# Patient Record
Sex: Female | Born: 1955 | Race: White | Hispanic: No | Smoking: Never smoker
Health system: Southern US, Community
[De-identification: ages and names within clinical notes are randomized; demographics above are authoritative.]

## PROBLEM LIST (undated history)

## (undated) DIAGNOSIS — G4730 Sleep apnea, unspecified: Secondary | ICD-10-CM

## (undated) DIAGNOSIS — I493 Ventricular premature depolarization: Secondary | ICD-10-CM

## (undated) DIAGNOSIS — H269 Unspecified cataract: Secondary | ICD-10-CM

## (undated) DIAGNOSIS — H409 Unspecified glaucoma: Secondary | ICD-10-CM

## (undated) DIAGNOSIS — Z9889 Other specified postprocedural states: Secondary | ICD-10-CM

## (undated) DIAGNOSIS — M67471 Ganglion, right ankle and foot: Secondary | ICD-10-CM

## (undated) DIAGNOSIS — M5030 Other cervical disc degeneration, unspecified cervical region: Secondary | ICD-10-CM

## (undated) DIAGNOSIS — I10 Essential (primary) hypertension: Secondary | ICD-10-CM

## (undated) DIAGNOSIS — G4700 Insomnia, unspecified: Secondary | ICD-10-CM

## (undated) DIAGNOSIS — R51 Headache: Secondary | ICD-10-CM

## (undated) DIAGNOSIS — R519 Headache, unspecified: Secondary | ICD-10-CM

## (undated) DIAGNOSIS — K219 Gastro-esophageal reflux disease without esophagitis: Secondary | ICD-10-CM

## (undated) DIAGNOSIS — K279 Peptic ulcer, site unspecified, unspecified as acute or chronic, without hemorrhage or perforation: Secondary | ICD-10-CM

## (undated) DIAGNOSIS — E785 Hyperlipidemia, unspecified: Secondary | ICD-10-CM

## (undated) DIAGNOSIS — E669 Obesity, unspecified: Secondary | ICD-10-CM

## (undated) DIAGNOSIS — R112 Nausea with vomiting, unspecified: Secondary | ICD-10-CM

## (undated) DIAGNOSIS — H16001 Unspecified corneal ulcer, right eye: Secondary | ICD-10-CM

## (undated) DIAGNOSIS — M7541 Impingement syndrome of right shoulder: Secondary | ICD-10-CM

## (undated) DIAGNOSIS — J189 Pneumonia, unspecified organism: Secondary | ICD-10-CM

## (undated) DIAGNOSIS — J45909 Unspecified asthma, uncomplicated: Secondary | ICD-10-CM

## (undated) HISTORY — DX: Pneumonia, unspecified organism: J18.9

## (undated) HISTORY — DX: Unspecified glaucoma: H40.9

## (undated) HISTORY — PX: ABDOMINAL HYSTERECTOMY: SHX81

## (undated) HISTORY — PX: FOOT SURGERY: SHX648

## (undated) HISTORY — PX: HAND SURGERY: SHX662

## (undated) HISTORY — PX: OTHER SURGICAL HISTORY: SHX169

## (undated) HISTORY — DX: Hyperlipidemia, unspecified: E78.5

## (undated) HISTORY — PX: REDUCTION MAMMAPLASTY: SUR839

## (undated) HISTORY — DX: Unspecified asthma, uncomplicated: J45.909

## (undated) HISTORY — DX: Gastro-esophageal reflux disease without esophagitis: K21.9

## (undated) HISTORY — PX: CERVICAL FUSION: SHX112

## (undated) HISTORY — PX: COLONOSCOPY: SHX174

## (undated) HISTORY — DX: Other cervical disc degeneration, unspecified cervical region: M50.30

## (undated) HISTORY — DX: Sleep apnea, unspecified: G47.30

## (undated) HISTORY — DX: Peptic ulcer, site unspecified, unspecified as acute or chronic, without hemorrhage or perforation: K27.9

## (undated) HISTORY — DX: Insomnia, unspecified: G47.00

## (undated) HISTORY — DX: Essential (primary) hypertension: I10

## (undated) HISTORY — PX: FOOT NEUROMA SURGERY: SHX646

---

## 1990-09-23 HISTORY — PX: SALIVARY GLAND SURGERY: SHX768

## 2003-05-20 ENCOUNTER — Encounter: Admission: RE | Admit: 2003-05-20 | Discharge: 2003-05-20 | Payer: Self-pay | Admitting: *Deleted

## 2003-05-26 ENCOUNTER — Ambulatory Visit (HOSPITAL_COMMUNITY): Admission: RE | Admit: 2003-05-26 | Discharge: 2003-05-26 | Payer: Self-pay | Admitting: Gastroenterology

## 2003-12-22 ENCOUNTER — Encounter: Admission: RE | Admit: 2003-12-22 | Discharge: 2003-12-22 | Payer: Self-pay | Admitting: *Deleted

## 2005-03-18 ENCOUNTER — Encounter: Admission: RE | Admit: 2005-03-18 | Discharge: 2005-03-18 | Payer: Self-pay | Admitting: Internal Medicine

## 2005-05-18 ENCOUNTER — Encounter: Admission: RE | Admit: 2005-05-18 | Discharge: 2005-05-18 | Payer: Self-pay | Admitting: Orthopaedic Surgery

## 2005-06-07 ENCOUNTER — Encounter: Admission: RE | Admit: 2005-06-07 | Discharge: 2005-06-07 | Payer: Self-pay | Admitting: Obstetrics and Gynecology

## 2005-06-18 ENCOUNTER — Other Ambulatory Visit: Admission: RE | Admit: 2005-06-18 | Discharge: 2005-06-18 | Payer: Self-pay | Admitting: Obstetrics and Gynecology

## 2005-10-25 ENCOUNTER — Encounter (INDEPENDENT_AMBULATORY_CARE_PROVIDER_SITE_OTHER): Payer: Self-pay | Admitting: *Deleted

## 2005-10-25 ENCOUNTER — Ambulatory Visit (HOSPITAL_COMMUNITY): Admission: RE | Admit: 2005-10-25 | Discharge: 2005-10-25 | Payer: Self-pay | Admitting: Obstetrics and Gynecology

## 2006-01-31 ENCOUNTER — Encounter: Admission: RE | Admit: 2006-01-31 | Discharge: 2006-01-31 | Payer: Self-pay | Admitting: *Deleted

## 2007-02-09 ENCOUNTER — Encounter: Admission: RE | Admit: 2007-02-09 | Discharge: 2007-02-09 | Payer: Self-pay | Admitting: Internal Medicine

## 2008-01-15 ENCOUNTER — Encounter (INDEPENDENT_AMBULATORY_CARE_PROVIDER_SITE_OTHER): Payer: Self-pay | Admitting: Orthopedic Surgery

## 2008-01-15 ENCOUNTER — Ambulatory Visit (HOSPITAL_BASED_OUTPATIENT_CLINIC_OR_DEPARTMENT_OTHER): Admission: RE | Admit: 2008-01-15 | Discharge: 2008-01-15 | Payer: Self-pay | Admitting: Orthopedic Surgery

## 2009-03-12 ENCOUNTER — Emergency Department (HOSPITAL_COMMUNITY): Admission: EM | Admit: 2009-03-12 | Discharge: 2009-03-12 | Payer: Self-pay | Admitting: Emergency Medicine

## 2009-11-06 ENCOUNTER — Encounter: Admission: RE | Admit: 2009-11-06 | Discharge: 2009-11-06 | Payer: Self-pay | Admitting: Internal Medicine

## 2010-11-26 ENCOUNTER — Encounter (HOSPITAL_COMMUNITY): Payer: Self-pay

## 2010-11-28 ENCOUNTER — Encounter (HOSPITAL_COMMUNITY): Payer: Self-pay

## 2010-12-31 LAB — STREP A DNA PROBE: Group A Strep Probe: NEGATIVE

## 2010-12-31 LAB — RAPID STREP SCREEN (MED CTR MEBANE ONLY): Streptococcus, Group A Screen (Direct): NEGATIVE

## 2011-02-05 NOTE — Op Note (Signed)
Michele Quinn, Michele Quinn             ACCOUNT NO.:  [id number]   MEDICAL RECORD NO.:  [id number]          PATIENT TYPE:  AMB   LOCATION:  DSC                          FACILITY:  [facility]   PHYSICIAN:  Katy Fitch. Sypher, M.D. DATE OF BIRTH:  [birth year]   DATE OF PROCEDURE:  01/15/2008  DATE OF DISCHARGE:                               OPERATIVE REPORT   PREOPERATIVE DIAGNOSES:  Chronic draining mucoid cyst, dorsal aspect of  right thumb nail fold with early nail plate deformity and x-ray evidence  of progressive degenerative arthritis, right thumb interphalageal joint.   POSTOPERATIVE DIAGNOSES:  Chronic draining mucoid cyst, dorsal aspect of  right thumb nail fold with early nail plate deformity and x-ray evidence  of progressive degenerative arthritis, right thumb interphalageal joint.   OPERATION:  1. Debridement of mucoid cyst, right thumb nail fold.  2. Arthrotomy of right thumb interphalangeal joint with synovectomy,      removal of marginal osteophytes at base of the distal phalanx and      head of proximal phalanx, pressure irrigation of interphalangeal      joint, and synovial biopsy.   OPERATING SURGEON:  Katy Fitch. Sypher, MD   ASSISTANT:  Marveen Reeks Dasnoit, PA-C   ANESTHESIA:  2% lidocaine and metacarpal head level block, right thumb  supplemented by IV sedation.   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   She reported at least 3 episodes of mucoid cyst enlargement, rupture,  and drainage.  She was concerned that she was at risk to develop septic  arthritis at the DIP joint.  She was beginning to develop a small groove  in her central nail plate of the right thumb.   She sought a hand surgery consult and was advised to consider cyst  debridement, joint debridement, and  synovectomy in an effort to try to  resolve this process.   She understands that the treatment of mucoid cyst is an  art  not a true  science.  It is understood from collective experience that joint  debridement and marginal osteophyte excision as well as cyst excision is  typically successful in resolving this predicament.  There are no  guarantees, however, that she could not develop another mucoid cyst as  her arthritis progresses.   After informed consent, she was brought to the operating at this time.   Michele Quinn was brought to the operating room and placed in supine  position on the operating table.   Following light sedation, the right arm was prepped with Betadine soap  solution and sterilely draped.  A pneumatic tourniquet was applied in  the proximal right brachium.  Our intention was to use a digital  tourniquet.   After Betadine prep, 2% lidocaine was infiltrated into the metacarpal  head region surrounding the digital nerves and dorsal sensory branches  of the radial superficial sensory branches to obtain a thumb block.  After 5 minutes,  excellent anesthesia was achieved.   The right arm was then prepped with Betadine soap solution and sterilely  draped.  1 gram of Ancef was administered as an IV prophylactic  antibiotic.  The thumb was exsanguinated with a gauze wrap and a 0.5-  inch Penrose drain placed at the MP joint level as a digital tourniquet.   The procedure commenced with a circumferential dissection of the mucoid  cyst.  A small margin of normal skin was resected including the entire  cyst.  The cyst was followed to the dorsal radial aspect of the joint.  The sinus tract was curetted with a microcurette followed by arthrotomy  on the radial aspect of the right thumb IP joint.  The capsule between  the radial collateral ligament and the terminal extensor tendon slip was  resected followed by synovectomy using a microrongeur.  There was a  large  osteophyte at the base of the proximal phalangeal head dorsally.  This was removed with a microcurette and use of rongeur.  After complete  synovectomy, the joint was irrigated with pressure utilizing a 19-gauge  dental needle.   The distal phalangeal base was then inspected and found to have a  marginal osteophyte that was debrided with a fine rongeur.  The  collateral ligament was left intact.  The joint was irrigated a final  time followed by repair of the skin with trauma sutures of 5-0 nylon.  The wound was dressed with Xeroflo sterile gauze and a Coban thumb spica  dressing.  There were no apparent complications.   Michele Quinn tolerated surgery and anesthesia well.  She was transferred  to the recovery room with stable signs.   She will be discharged home with prescriptions for Darvocet-N 100 1 p.o.  q. 4-6h. p.r.n. pain 20 tablets without refill.  Also, Keflex 500 mg 1  p.o. q.8 h. x4 days as a prophylactic antibiotic.      Katy Fitch Sypher, M.D.  Electronically Signed     RVS/MEDQ  D:  01/15/2008  T:  01/16/2008  Job:  [id number]   cc:   Thora Lance, M.D.

## 2011-02-08 NOTE — Op Note (Signed)
NAME:  NAI, BORROMEO                       ACCOUNT NO.:  [id number]   MEDICAL RECORD NO.:  [id number]                   PATIENT TYPE:  AMB   LOCATION:  ENDO                                 FACILITY:  [facility]   PHYSICIAN:  Bernette Redbird, M.D.                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  However, her ultrasound did not show any gallstones.  She has a remote  history of peptic ulcer disease and in fact has been using Advil a fair  amount in the past month, but she has not got any relief so far by the use  of Protonix.   FINAL DIAGNOSIS:  Small hiatal hernia, otherwise normal exam.   PROCEDURE:  The nature, purpose, and risks of the procedure were familiar to  the patient and she provided consent.  At her request, the procedure was  done without sedation so that she could go right back to work.  She came in  a fasted state to the Endoscopy Unit and was given viscous lidocaine as a  gargle to anesthetize the throat.   With the patient in the left lateral decubitus position, I passed the small  caliber adult Olympus video-endoscope under direct vision.  The vocal cords  were not well seen, but the esophagus was very readily entered and had  normal mucosa throughout its entire length without evidence of any reflux  esophagitis, Barrett esophagus, varices, infection, or neoplasia.  There was  a 2 cm hiatal hernia present without any significant esophageal ring.  The  stomach was entered and contained no residual.  The gastric mucosa was  normal, without evidence of gastritis, erosions, ulcers,  polyps, or masses,  and the retroflex view of the proximal stomach was unremarkable.  The  pylorus, duodenal bulb, and second duodenum were normal.  The duodenal bulb  was reinspected, as was the antrum of the stomach and, again, no evidence of  ulcer disease was seen.   The scope was removed from the patient, who tolerated the procedure well and  without apparent complication.   IMPRESSION:  Small hiatal hernia, otherwise normal upper endoscopy.  No  source of recent episodic abdominal pain identified.    PLAN:  Proceed to CCK stimulated hepatobiliary scan to look for further  evidence of gallbladder disease, despite the negative ultrasound test.  Bernette Redbird, M.D.    RB/MEDQ  D:  05/26/2003  T:  05/27/2003  Job:  [id number]   cc:   Angelia Mould. Derrell Lolling, M.D.  [address]  [place]  [place] [address]  Fax: [phone]   Thora Lance, M.D.  [address]  [place]  [place] [address]  Fax: [phone]

## 2011-02-08 NOTE — H&P (Signed)
Michele Quinn, Michele Quinn             ACCOUNT NO.:  [id number]   MEDICAL RECORD NO.:  [id number]          PATIENT TYPE:  AMB   LOCATION:  SDC                           [facility]   PHYSICIAN:  Guy Sandifer. Henderson Cloud, M.D. DATE OF BIRTH:  [birth year]   DATE OF ADMISSION:  10/25/2005  DATE OF DISCHARGE:                                HISTORY & PHYSICAL   CHIEF COMPLAINT:  Ovarian cyst.   HISTORY OF PRESENT ILLNESS:  This patient is a 55 year old married white  female, G3, P2, status post vaginal hysterectomy and A&P repair  approximately 15 years ago.  She recently had an MRI done to evaluate her  hip pain.  There was an incidental finding of a 4 cm right ovarian cyst.  On  June 07, 2005, a repeat ultrasound was consistent with a 4.5 cm simple  cyst of the right ovary.  The left ovary had two small follicles.  She has  had some bumper dyspareunia on the right side on and off.  Repeat ultrasound  in my office on October 17, 2005, revealed a 5.1 cm simple cyst of the right  ovary and a 1.7 cm simple cyst of the left ovary.  CA125 level on August 19, 2005, is normal at 21.6.  After discussion of the options, she is being  admitted for laparoscopy with bilateral salpingo-oophorectomy.  The patient  has been having some vasomotor symptoms on and off.  The potential risks and  complications of surgery have been discussed preoperatively.   PAST MEDICAL HISTORY:  1.  History of reflux.  2.  History of superficial varicosities.   PAST SURGICAL HISTORY:  1.  TVH with A&P repair.  2.  Breast reduction.  3.  Septoplasty.  4.  Sinus surgery.  5.  Salivary gland removal.   OBSTETRICAL HISTORY:  Vaginal delivery x2.   MEDICATIONS:  Diovan, Lunesta and Lortab.   No known drug allergies.   FAMILY HISTORY:  Positive for diabetes, cervical cancer in grandmother,  endometrial cancer in mother, high blood pressure, coronary artery disease,  rheumatic fever, lung disease, kidney disease.   REVIEW OF SYSTEMS:  NEUROLOGIC:  Denies headache.  CARDIAC:  Denies chest  pain.  PULMONARY:  Denies shortness of breath.  GASTROINTESTINAL:  Denies  recent changes in bowel habits.   PHYSICAL EXAMINATION:  VITAL SIGNS:  Height 5 feet 5-1/2 inches.  Weight 194  pounds.  Blood pressure 124/80.  HEENT:  Without thyromegaly.  LUNGS:  Clear to auscultation.  CARDIAC:  Regular rate and rhythm.  BACK:  Without CVA tenderness.  BREASTS:  Without mass, retraction or discharge.  ABDOMEN:  Soft, nontender, without masses.  PELVIC:  Vulva, vagina without lesions.  Right adnexa mildly tender with a  diffuse mass effect.  Left adnexa nontender, without palpable masses.  EXTREMITIES:  Grossly within normal limits.  NEUROLOGIC:  Grossly within normal limits.   ASSESSMENT:  Bilateral ovarian cysts.   PLAN:  Laparoscopy with bilateral salpingo-oophorectomy.      Guy Sandifer Henderson Cloud, M.D.  Electronically Signed     JET/MEDQ  D:  10/17/2005  T:  10/17/2005  Job:  [id number]

## 2011-02-08 NOTE — Op Note (Signed)
NAMEUGOCHI, HENZLER             ACCOUNT NO.:  [id number]   MEDICAL RECORD NO.:  [id number]          PATIENT TYPE:  AMB   LOCATION:  SDC                           [facility]   PHYSICIAN:  Guy Sandifer. Henderson Cloud, M.D. DATE OF BIRTH:  [birth year]   DATE OF PROCEDURE:  10/25/2005  DATE OF DISCHARGE:                                 OPERATIVE REPORT   PREOPERATIVE DIAGNOSIS:  Bilateral ovarian cysts.   POSTOPERATIVE DIAGNOSIS:  Bilateral ovarian cysts.   OPERATION/PROCEDURE:  Laparoscopic bilateral salpingo-oophorectomy.   SURGEON:  Guy Sandifer. Henderson Cloud, M.D.   ANESTHESIA:  General endotracheal anesthesia.   ESTIMATED BLOOD LOSS:  Drops.   SPECIMENS:  Bilateral ovaries and aspirate of right ovarian cyst.   INDICATIONS AND CONSENT:  The patient is a 55 year old, married white  female, gravida 3, para 2, status post vaginal hysterectomy who has a right  ovarian cyst and small left ovarian cyst.  Details are dictated in the  history and physical.  Laparoscopic BSO is discussed with the patient  preoperatively.  Potential risks and complications were discussed  preoperatively including, but not limited to, infection, bowel, bladder,  ureteral damage, bleeding requiring transfusion and blood products, possible  transfusion reaction, HIV and  hepatitis acquisition, DVT, PE, pneumonia,  laparotomy.  All questions have been answered and consent is signed on the  chart.   FINDINGS:  Upper abdomen is grossly normal.  Peritoneum surfaces are smooth  without excrescences.  Right ovary contains an approximately 8 cm smooth  translucent cyst which is otherwise free.  Left ovary appears normal.  Posterior cul-de-sac is normal.   DESCRIPTION OF PROCEDURE:  The patient was taken to the operating room where  she was identified, placed in the dorsal lithotomy position and general  anesthesia is induced via endotracheal intubation.  She has been placed in  the dorsal lithotomy position, prepped  abdominally and vaginally.  Bladder  straight catheterized.  A ring clamp with two prep sponges is placed in the  vagina and the patient is draped in the sterile fashion.  A small  infraumbilical incision is made after the injection with 0.5% plain  Marcaine.  A disposable Veress needle is placed with a normal syringe and  drop test.  Two liters of gas were insufflated to low pressure with good  tympany in the right upper quadrant.  Veress needle is removed and using two  towel clips to elevate the anterior abdominal wall, a 10/11 X-cel disposable  trocar sleeve is placed using direct visualization method with the  diagnostic laparoscope.  Peritoneal cavity is entered without difficulty.  Reinspection was the operative laparoscopic again reveals no damage to the  surrounding structures and confirms proper placement.  Small suprapubic  incision is made and later a left lower quadrant incision is made after  careful transillumination with 5 mm disposable X-cel trocar sleeves are both  placed under direct visualization without difficulty.  The above findings  were noted.  Elevating the right ovary from the pelvic sidewall, the Gyrus  bipolar cautery cutting instrument is used to take down the  infundibulopelvic ligament and the  remainder of the ovary.  Good hemostasis  is maintained.  The 5 mm laparoscope is then placed through the suprapubic  trocar sleeve and the pouch is placed through the umbilical trocar sleeve.  After the ovary is in the pouch, an aspirator is used to aspirate  approximately 40 mL of straw-color serous fluid.  This greatly decompresses  the cyst.  There is no spill under the peritoneal cavity.  Then while  watching with the 5 mm laparoscope from below, the bag is retrieved with the  right ovary through the umbilical incision.  Going back to the operative  laparoscope, the Gyrus is again used to take down the left ovary.  It is  retrieved from the abdomen in separate  pouch in the same manner as the right  ovary.  Irrigation and careful reinspection through the umbilical trocar  sleeve and inspection under reduced pneumoperitoneum reveals excellent  hemostasis all around.  Excess fluid is removed.  Inferior trocar sleeves  are removed and the peritoneum is reduced and no bleeding is  noted from  both sides.  Pneumoperitoneum is completely reduced and umbilical trocar  sleeve is removed.  The umbilical incision is closed with simple sutures of  2-0 Vicryl.  After injecting the lower incisions with 0.5% plain Marcaine as  well, Dermabond is used to close all of the above incisions.  The ring clamp  is removed from the vagina.  All counts are correct.  The patient is  awakened and taken to the recovery room in stable condition.      Guy Sandifer Henderson Cloud, M.D.  Electronically Signed     JET/MEDQ  D:  10/25/2005  T:  10/25/2005  Job:  [id number]

## 2011-05-22 ENCOUNTER — Other Ambulatory Visit: Payer: Self-pay | Admitting: Neurological Surgery

## 2011-05-22 DIAGNOSIS — M542 Cervicalgia: Secondary | ICD-10-CM

## 2011-05-22 DIAGNOSIS — M79602 Pain in left arm: Secondary | ICD-10-CM

## 2011-05-26 ENCOUNTER — Ambulatory Visit
Admission: RE | Admit: 2011-05-26 | Discharge: 2011-05-26 | Disposition: A | Discharge status: Home / Self Care | Payer: 59 | Source: Ambulatory Visit | Attending: Neurological Surgery | Admitting: Neurological Surgery

## 2011-05-26 DIAGNOSIS — M79602 Pain in left arm: Secondary | ICD-10-CM

## 2011-05-26 DIAGNOSIS — M542 Cervicalgia: Secondary | ICD-10-CM

## 2011-06-18 LAB — BASIC METABOLIC PANEL
BUN: 21
CO2: 30
Calcium: 9.2
Chloride: 101
Creatinine, Ser: 0.75
GFR calc Af Amer: >60
GFR calc non Af Amer: >60
Glucose, Bld: 94
Potassium: 4.3
Sodium: 137

## 2011-06-18 LAB — POCT HEMOGLOBIN-HEMACUE: Hemoglobin: 15.3 — ABNORMAL HIGH

## 2011-07-08 ENCOUNTER — Ambulatory Visit
Admission: RE | Admit: 2011-07-08 | Discharge: 2011-07-08 | Disposition: A | Discharge status: Home / Self Care | Payer: 59 | Source: Ambulatory Visit | Attending: Neurological Surgery | Admitting: Neurological Surgery

## 2011-07-08 ENCOUNTER — Other Ambulatory Visit: Payer: Self-pay | Admitting: Neurological Surgery

## 2011-07-08 DIAGNOSIS — M542 Cervicalgia: Secondary | ICD-10-CM

## 2011-10-04 ENCOUNTER — Other Ambulatory Visit: Payer: Self-pay | Admitting: Orthopaedic Surgery

## 2011-10-04 DIAGNOSIS — M25552 Pain in left hip: Secondary | ICD-10-CM

## 2011-10-04 DIAGNOSIS — R1030 Lower abdominal pain, unspecified: Secondary | ICD-10-CM

## 2011-10-04 DIAGNOSIS — M25551 Pain in right hip: Secondary | ICD-10-CM

## 2011-10-08 ENCOUNTER — Inpatient Hospital Stay: Admission: RE | Admit: 2011-10-08 | Payer: 59 | Source: Ambulatory Visit

## 2011-10-15 ENCOUNTER — Ambulatory Visit
Admission: RE | Admit: 2011-10-15 | Discharge: 2011-10-15 | Disposition: A | Discharge status: Home / Self Care | Payer: 59 | Source: Ambulatory Visit | Attending: Orthopaedic Surgery | Admitting: Orthopaedic Surgery

## 2011-10-15 DIAGNOSIS — M25551 Pain in right hip: Secondary | ICD-10-CM

## 2011-10-15 DIAGNOSIS — M25552 Pain in left hip: Secondary | ICD-10-CM

## 2011-10-15 DIAGNOSIS — R1030 Lower abdominal pain, unspecified: Secondary | ICD-10-CM

## 2011-11-22 ENCOUNTER — Telehealth (INDEPENDENT_AMBULATORY_CARE_PROVIDER_SITE_OTHER): Payer: Self-pay | Admitting: General Surgery

## 2011-11-22 ENCOUNTER — Other Ambulatory Visit (INDEPENDENT_AMBULATORY_CARE_PROVIDER_SITE_OTHER): Payer: Self-pay | Admitting: Surgery

## 2011-11-22 DIAGNOSIS — J40 Bronchitis, not specified as acute or chronic: Secondary | ICD-10-CM

## 2011-11-22 MED ORDER — AZITHROMYCIN 250 MG PO TABS: ORAL_TABLET | ORAL | Status: AC

## 2011-11-22 NOTE — Telephone Encounter (Signed)
Zpak called to [employer]

## 2011-11-25 NOTE — Progress Notes (Signed)
Patient has sx of URI X 5-6 days. Now with more purulent nasal discharge and cough. Clinically appears to have developing sinusitis and has Hx bronchitis and may be also developing this - although no fever or productive cough yet. Will treat with Z-pack empirically

## 2012-07-08 ENCOUNTER — Other Ambulatory Visit: Payer: Self-pay | Admitting: Internal Medicine

## 2012-07-08 DIAGNOSIS — Z1231 Encounter for screening mammogram for malignant neoplasm of breast: Secondary | ICD-10-CM

## 2012-08-13 ENCOUNTER — Ambulatory Visit: Payer: 59

## 2012-10-23 ENCOUNTER — Ambulatory Visit
Admission: RE | Admit: 2012-10-23 | Discharge: 2012-10-23 | Disposition: A | Discharge status: Home / Self Care | Payer: 59 | Source: Ambulatory Visit | Attending: Internal Medicine | Admitting: Internal Medicine

## 2012-10-23 DIAGNOSIS — Z1231 Encounter for screening mammogram for malignant neoplasm of breast: Secondary | ICD-10-CM

## 2013-09-23 DIAGNOSIS — G4730 Sleep apnea, unspecified: Secondary | ICD-10-CM

## 2013-09-23 HISTORY — DX: Sleep apnea, unspecified: G47.30

## 2013-11-11 ENCOUNTER — Other Ambulatory Visit: Payer: Self-pay | Admitting: Internal Medicine

## 2013-11-11 ENCOUNTER — Ambulatory Visit
Admission: RE | Admit: 2013-11-11 | Discharge: 2013-11-11 | Disposition: A | Discharge status: Home / Self Care | Payer: 59 | Source: Ambulatory Visit | Attending: Internal Medicine | Admitting: Internal Medicine

## 2013-11-11 DIAGNOSIS — R509 Fever, unspecified: Secondary | ICD-10-CM

## 2013-11-17 ENCOUNTER — Other Ambulatory Visit (INDEPENDENT_AMBULATORY_CARE_PROVIDER_SITE_OTHER): Payer: Self-pay | Admitting: Surgery

## 2013-11-17 DIAGNOSIS — H8110 Benign paroxysmal vertigo, unspecified ear: Secondary | ICD-10-CM

## 2013-11-17 MED ORDER — MECLIZINE HCL 32 MG PO TABS: 32.0000 mg | ORAL_TABLET | Freq: Three times a day (TID) | ORAL | Status: DC | PRN

## 2014-06-21 ENCOUNTER — Other Ambulatory Visit: Payer: Self-pay | Admitting: Orthopaedic Surgery

## 2014-06-21 DIAGNOSIS — M25511 Pain in right shoulder: Secondary | ICD-10-CM

## 2014-07-08 ENCOUNTER — Encounter: Payer: Self-pay | Admitting: Cardiology

## 2014-07-08 ENCOUNTER — Other Ambulatory Visit: Payer: Self-pay

## 2014-07-08 ENCOUNTER — Ambulatory Visit (INDEPENDENT_AMBULATORY_CARE_PROVIDER_SITE_OTHER): Payer: 59

## 2014-07-08 DIAGNOSIS — R55 Syncope and collapse: Secondary | ICD-10-CM

## 2014-07-08 DIAGNOSIS — M25511 Pain in right shoulder: Secondary | ICD-10-CM

## 2014-07-09 ENCOUNTER — Encounter: Payer: Self-pay | Admitting: *Deleted

## 2014-07-13 ENCOUNTER — Telehealth: Payer: Self-pay | Admitting: *Deleted

## 2014-07-13 NOTE — Telephone Encounter (Signed)
Dropped monitor off on Monday. Was ordered by DOD, referred by Dr. Valentina LucksGriffin. Informed patient that monitor report has not been received yet, once it is,it will be reviewed by MD. After that, she will be made aware of the results. Verbalizes understanding and agreement. She is hoping to hear by the end of the week.

## 2014-07-13 NOTE — Telephone Encounter (Signed)
New message ° ° ° °Pt is calling for monitor results. °

## 2015-02-28 ENCOUNTER — Other Ambulatory Visit: Payer: Self-pay

## 2015-02-28 DIAGNOSIS — Z1231 Encounter for screening mammogram for malignant neoplasm of breast: Secondary | ICD-10-CM

## 2015-03-14 ENCOUNTER — Ambulatory Visit
Admission: RE | Admit: 2015-03-14 | Discharge: 2015-03-14 | Disposition: A | Discharge status: Home / Self Care | Payer: 59 | Source: Ambulatory Visit

## 2015-03-14 DIAGNOSIS — Z1231 Encounter for screening mammogram for malignant neoplasm of breast: Secondary | ICD-10-CM

## 2015-07-28 ENCOUNTER — Ambulatory Visit
Admission: RE | Admit: 2015-07-28 | Discharge: 2015-07-28 | Disposition: A | Discharge status: Home / Self Care | Payer: Commercial Managed Care - HMO | Source: Ambulatory Visit | Attending: Orthopaedic Surgery | Admitting: Orthopaedic Surgery

## 2015-11-28 ENCOUNTER — Other Ambulatory Visit: Payer: Self-pay | Admitting: Neurological Surgery

## 2015-11-28 DIAGNOSIS — M545 Low back pain: Secondary | ICD-10-CM

## 2015-12-03 ENCOUNTER — Ambulatory Visit
Admission: RE | Admit: 2015-12-03 | Discharge: 2015-12-03 | Disposition: A | Discharge status: Home / Self Care | Payer: Commercial Managed Care - HMO | Source: Ambulatory Visit | Attending: Neurological Surgery | Admitting: Neurological Surgery

## 2015-12-03 DIAGNOSIS — M545 Low back pain: Secondary | ICD-10-CM

## 2016-01-22 ENCOUNTER — Other Ambulatory Visit: Payer: Self-pay | Admitting: Neurological Surgery

## 2016-01-22 DIAGNOSIS — M4316 Spondylolisthesis, lumbar region: Secondary | ICD-10-CM

## 2016-01-24 ENCOUNTER — Ambulatory Visit
Admission: RE | Admit: 2016-01-24 | Discharge: 2016-01-24 | Disposition: A | Discharge status: Home / Self Care | Payer: Commercial Managed Care - HMO | Source: Ambulatory Visit | Attending: Neurological Surgery | Admitting: Neurological Surgery

## 2016-01-24 DIAGNOSIS — M4316 Spondylolisthesis, lumbar region: Secondary | ICD-10-CM

## 2016-01-24 MED ORDER — METHYLPREDNISOLONE ACETATE 40 MG/ML INJ SUSP (RADIOLOG
120.0000 mg | Freq: Once | INTRAMUSCULAR | Status: AC
  Administered 2016-01-24: 120 mg via INTRA_ARTICULAR

## 2016-01-24 MED ORDER — IOPAMIDOL (ISOVUE-M 200) INJECTION 41%
1.0000 mL | Freq: Once | INTRAMUSCULAR | Status: AC
  Administered 2016-01-24: 1 mL via INTRA_ARTICULAR

## 2016-01-24 NOTE — Discharge Instructions (Signed)

## 2016-04-23 ENCOUNTER — Other Ambulatory Visit: Payer: Self-pay | Admitting: Neurological Surgery

## 2016-04-23 DIAGNOSIS — M4316 Spondylolisthesis, lumbar region: Secondary | ICD-10-CM

## 2016-04-30 ENCOUNTER — Ambulatory Visit
Admission: RE | Admit: 2016-04-30 | Discharge: 2016-04-30 | Disposition: A | Discharge status: Home / Self Care | Payer: Commercial Managed Care - HMO | Source: Ambulatory Visit | Attending: Neurological Surgery | Admitting: Neurological Surgery

## 2016-04-30 DIAGNOSIS — M4316 Spondylolisthesis, lumbar region: Secondary | ICD-10-CM

## 2016-04-30 MED ORDER — METHYLPREDNISOLONE ACETATE 40 MG/ML INJ SUSP (RADIOLOG
120.0000 mg | Freq: Once | INTRAMUSCULAR | Status: AC
  Administered 2016-04-30: 120 mg via INTRA_ARTICULAR

## 2016-04-30 MED ORDER — IOPAMIDOL (ISOVUE-M 200) INJECTION 41%
1.0000 mL | Freq: Once | INTRAMUSCULAR | Status: AC
  Administered 2016-04-30: 1 mL via INTRA_ARTICULAR

## 2016-04-30 NOTE — Discharge Instructions (Signed)

## 2016-08-29 ENCOUNTER — Encounter (INDEPENDENT_AMBULATORY_CARE_PROVIDER_SITE_OTHER): Payer: Self-pay | Admitting: Orthopaedic Surgery

## 2016-08-29 ENCOUNTER — Ambulatory Visit (INDEPENDENT_AMBULATORY_CARE_PROVIDER_SITE_OTHER): Payer: Commercial Managed Care - HMO | Admitting: Orthopaedic Surgery

## 2016-08-29 VITALS — BP 133/70 | HR 74 | Resp 14 | Ht 64.5 in | Wt 200.0 lb

## 2016-08-29 DIAGNOSIS — M7541 Impingement syndrome of right shoulder: Secondary | ICD-10-CM | POA: Diagnosis not present

## 2016-08-29 DIAGNOSIS — M67471 Ganglion, right ankle and foot: Secondary | ICD-10-CM | POA: Diagnosis not present

## 2016-08-29 DIAGNOSIS — M75101 Unspecified rotator cuff tear or rupture of right shoulder, not specified as traumatic: Secondary | ICD-10-CM | POA: Insufficient documentation

## 2016-08-29 NOTE — Progress Notes (Signed)
Office Visit Note   Patient: Michele Quinn           Date of Birth: 06-09-1956           MRN: [id number] Visit Date: 08/29/2016              Requested by: No referring provider defined for this encounter. PCP: Lillia MountainGRIFFIN,JOHN JOSEPH, MD   Assessment & Plan: Visit Diagnoses:  1. Rotator cuff tear, non-traumatic, right   2. Impingement syndrome of right shoulder   3. Ganglion cyst of right foot     Plan:  #1: Plan for Arthroscopic subacromial decompression with distal clavicle excision, mini open rotator cuff repair possible dermis span patch.  Also, excision of ganglion cyst of the right lateral at the proximal fourth and fifth metatarsal  Follow-Up Instructions: Return will schedule surgery.   Orders:  No orders of the defined types were placed in this encounter.  No orders of the defined types were placed in this encounter.     Procedures: No procedures performed   Clinical Data: No additional findings.   Subjective: Chief Complaint  Patient presents with  . Right Shoulder - Pain    Michele Quinn is a 60 year old white female history of Right shoulder pain x over 1- 2  years, worsening x 1 week, difficulty sleeping, limited range of motion, weakness, Advil x 1 day, nothing working for pain, injury x 3-4 years ago.   She was apparently scheduled for arthroscopic surgery at the end of last year but apparently developed bronchitis had to cancel that and then also canceled because she had a trip coming up in January. She suffered the significantly over the past year but has waited till now cause over the past week she started developing severe pain in the shoulder. She had been using a 0 radius lawnmower and had been doing some raking. Her symptoms now pain with every motion as well as given aching pain issues tries to sleep. Seen today for evaluation.    Review of Systems  Constitutional: Negative.   HENT: Negative.   Respiratory:       Sleep apnea and seasonal asthma    Cardiovascular:       Hypertension  Gastrointestinal: Negative.   Genitourinary: Negative.   Skin: Negative.   Neurological: Negative.   Hematological: Negative.   Psychiatric/Behavioral: Negative.      Objective: Vital Signs: BP 133/70 (BP Location: Left Arm, Patient Position: Sitting, Cuff Size: Normal)   Pulse 74   Resp 14   Ht 5' 4.5" (1.638 m)   Wt 200 lb (90.7 kg)   BMI 33.80 kg/m   Physical Exam  Constitutional: She is oriented to person, place, and time. She appears well-developed and well-nourished.  HENT:  Head: Normocephalic and atraumatic.  Eyes: EOM are normal. Pupils are equal, round, and reactive to light.  Neck:  No carotid bruits  Pulmonary/Chest: Effort normal.  Neurological: She is alert and oriented to person, place, and time.  Skin: Skin is warm and dry.  Psychiatric: She has a normal mood and affect. Her behavior is normal. Judgment and thought content normal.    Right Ankle Exam  Right ankle exam is normal.  Comments:  Cystic lesion over the small fourth fifth metatarsal area multilobulated   Right Shoulder Exam   Tenderness  The patient is experiencing tenderness in the acromioclavicular joint, acromion and biceps tendon.  Muscle Strength  Abduction: 3/5  Internal Rotation: 4/5  External Rotation: 3/5  Supraspinatus: 3/5  Subscapularis: 4/5   Tests  Apprehension: positive Impingement: positive  Other  Sensation: normal Pulse: present  Comments:  Decreased range of motion secondary to pain throughout the shoulder.      Specialty Comments:  No specialty comments available.  Imaging: CLINICAL DATA:  Right shoulder pain for 3 years. Heard a pop pulling herself up. Limited range of motion.  EXAM: MRI OF THE RIGHT SHOULDER WITHOUT CONTRAST  TECHNIQUE: Multiplanar, multisequence MR imaging of the shoulder was performed. No intravenous contrast was administered.  COMPARISON:  None.  FINDINGS: Rotator cuff: Severe  tendinosis of the supraspinatus tendon with a small insertional interstitial tear. Severe tendinosis of the infraspinatus tendon with a possible small partial thickness insertional bursal surface tear (image 6/series 5). Teres minor tendon is intact. Subscapularis tendon is intact.  Muscles: No atrophy or fatty replacement of nor abnormal signal within, the muscles of the rotator cuff.  Biceps long head:  Intact.  Acromioclavicular Joint: Normal acromioclavicular joint. Type II acromion. Incidental note made of an os acromiale with mild edema adjacent to the synchondrosis which may reflect instability. Small amount of subacromial/subdeltoid bursal fluid.  Glenohumeral Joint: No significant joint effusion. No focal chondral defect. Normal alignment.  Labrum: Grossly intact, but evaluation is limited by lack of intraarticular fluid.  Bones: No focal marrow signal abnormality. No acute fracture or dislocation. No aggressive osseous lesion.  IMPRESSION: 1. Severe tendinosis of the supraspinatus tendon with a small insertional interstitial tear. 2. Severe tendinosis of the infraspinatus tendon with a possible small partial thickness insertional bursal surface tear (image 6/series 5). 3. Mild subacromial/subdeltoid bursitis. 4. 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    PMFS History: Patient Active Problem List   Diagnosis Date Noted  . Rotator cuff tear, non-traumatic, right 08/29/2016  . Benign paroxysmal positional vertigo 11/17/2013  . Bronchitis 11/22/2011   Past Medical History:  Diagnosis Date  . Asthma   . Degenerative disc disease, cervical   . GERD (gastroesophageal reflux disease)   . Glaucoma   . Hyperlipidemia   . Hypertension   . Insomnia   . Peptic ulcer disease   . Pneumonia   . Sleep apnea 2015    Family History  Problem Relation Age of Onset  . Lymphoma Mother   . Endometrial cancer  Mother   . Hypertension Mother   . Heart attack Mother   . COPD Father   . Hypertension Father   . Diabetes Father   . Macular degeneration Father   . Heart failure Father   . Colon polyps Father   . Dementia Father   . Hyperlipidemia Sister   . Hypertension Sister   . Diabetes Brother   . Diabetes Brother     Past Surgical History:  Procedure Laterality Date  . CERVICAL FUSION    . FOOT NEUROMA SURGERY Left   . FOOT SURGERY Left   . HAND SURGERY    . reduction mammoplasty    . SALIVARY GLAND SURGERY Right 1992   Social History   Occupational History  . Not on file.   Social History Main Topics  . Smoking status: Never Smoker  . Smokeless tobacco: Never Used  . Alcohol use 1.2 - 1.8 oz/week    2 - 3 Glasses of wine per week  . Drug use: No  . Sexual activity: Not on file

## 2016-09-25 ENCOUNTER — Encounter (HOSPITAL_COMMUNITY): Payer: Self-pay

## 2016-09-25 ENCOUNTER — Encounter (HOSPITAL_COMMUNITY)
Admission: RE | Admit: 2016-09-25 | Discharge: 2016-09-25 | Disposition: A | Discharge status: Home / Self Care | Payer: Commercial Managed Care - HMO | Source: Ambulatory Visit | Attending: Orthopaedic Surgery | Admitting: Orthopaedic Surgery

## 2016-09-25 DIAGNOSIS — R9431 Abnormal electrocardiogram [ECG] [EKG]: Secondary | ICD-10-CM | POA: Diagnosis not present

## 2016-09-25 DIAGNOSIS — I10 Essential (primary) hypertension: Secondary | ICD-10-CM | POA: Insufficient documentation

## 2016-09-25 DIAGNOSIS — Z01812 Encounter for preprocedural laboratory examination: Secondary | ICD-10-CM | POA: Insufficient documentation

## 2016-09-25 DIAGNOSIS — Z01810 Encounter for preprocedural cardiovascular examination: Secondary | ICD-10-CM | POA: Diagnosis not present

## 2016-09-25 HISTORY — DX: Obesity, unspecified: E66.9

## 2016-09-25 HISTORY — DX: Headache: R51

## 2016-09-25 HISTORY — DX: Other specified postprocedural states: Z98.890

## 2016-09-25 HISTORY — DX: Ventricular premature depolarization: I49.3

## 2016-09-25 HISTORY — DX: Impingement syndrome of right shoulder: M75.41

## 2016-09-25 HISTORY — DX: Unspecified cataract: H26.9

## 2016-09-25 HISTORY — DX: Ganglion, right ankle and foot: M67.471

## 2016-09-25 HISTORY — DX: Nausea with vomiting, unspecified: R11.2

## 2016-09-25 HISTORY — DX: Unspecified corneal ulcer, right eye: H16.001

## 2016-09-25 HISTORY — DX: Headache, unspecified: R51.9

## 2016-09-25 LAB — CBC
HCT: 41.8 % (ref 36.0–46.0)
Hemoglobin: 13.6 g/dL (ref 12.0–15.0)
MCH: 28.3 pg (ref 26.0–34.0)
MCHC: 32.5 g/dL (ref 30.0–36.0)
MCV: 87.1 fL (ref 78.0–100.0)
Platelets: 269 10*3/uL (ref 150–400)
RBC: 4.8 MIL/uL (ref 3.87–5.11)
RDW: 13.1 % (ref 11.5–15.5)
WBC: 8.8 10*3/uL (ref 4.0–10.5)

## 2016-09-25 LAB — BASIC METABOLIC PANEL
Anion gap: 10 (ref 5–15)
BUN: 17 mg/dL (ref 6–20)
CO2: 26 mmol/L (ref 22–32)
Calcium: 9.7 mg/dL (ref 8.9–10.3)
Chloride: 106 mmol/L (ref 101–111)
Creatinine, Ser: 0.67 mg/dL (ref 0.44–1.00)
GFR calc Af Amer: >60 mL/min (ref >60)
GFR calc non Af Amer: >60 mL/min (ref >60)
Glucose, Bld: 97 mg/dL (ref 65–99)
Potassium: 3.9 mmol/L (ref 3.5–5.1)
Sodium: 142 mmol/L (ref 135–145)

## 2016-09-25 NOTE — Progress Notes (Signed)
Pt denies SOB, chest pain, and being under the care of a cardiologist. Pt denies having a cardiac cath but stated that an echo and stress test was performed > 10 years ago. Pt denies having a chest x rayy within the last year. Pt denies having recent labs. Pt chart forwarded to anesthesia to review cardiac history.

## 2016-09-25 NOTE — Pre-Procedure Instructions (Signed)
    Lonell GrandchildCynthia H Birchall  09/25/2016      [facility] [id number] - [place] - [address] AT [facility] [address] & [address] [place] [address] Phone: [phone] Fax: [phone]  [facility] [id number] - [place], [place] - [address] AT SEC [address] [place] [address] Phone: [phone] Fax: [phone]    Your procedure is scheduled on Tuesday, October 01, 2016  [facility]  Call this number if you have problems the morning of surgery:  [phone]   Remember:  Do not eat food or drink liquids after midnight Monday, September 30, 2016  Take these medicines the morning of surgery with A SIP OF WATER : metoprolol succinate (TOPROL-XL),  pantoprazole (PROTONIX), if needed: cetirizine (ZYRTEC) beclomethasone (QVAR)  For shortness of breath, albuterol (PROVENTIL HFA;VENTOLIN HFA)  inhaler for wheezing or shortness of breath ( bring inhalers in with you on day of surgery) Stop taking Aspirin, vitamins, fish oil and herbal medications ( Omega 3,Coenzyme Q10 (CO Q10).  Do not take any NSAIDs ie: Ibuprofen, Advil, Naproxen ( Anaprox), BC and Goody Powder or any medication containing Aspirin; stop now.  Do not wear jewelry, make-up or nail polish.  Do not wear lotions, powders, or perfumes, or deoderant.  Do not shave 48 hours prior to surgery.   Do not bring valuables to the hospital.  [facility] is not responsible for any belongings or valuables.  Contacts, dentures or bridgework may not be worn into surgery.  Leave your suitcase in the car.  After surgery it may be brought to your room.  For patients admitted to the hospital, discharge time will be determined by your treatment team.  Patients discharged the day of surgery will not be allowed to drive home.   Special instructions: Shower the night before surgery and the morning of surgery with  CHG.  Please read over the following fact sheets that you were given. Pain Booklet, Coughing and Deep Breathing and Surgical Site Infection Prevention

## 2016-09-29 NOTE — H&P (Addendum)
Patient: Michele Quinn                                    Date of Birth: 11/06/1955                                                 MRN: [id number] Visit Date: 08/29/2016                                                                     Requested by: No referring provider defined for this encounter. PCP: Lillia MountainGRIFFIN,JOHN JOSEPH, MD   Assessment & Plan: Visit Diagnoses:  1. Rotator cuff tear, non-traumatic, right   2. Impingement syndrome of right shoulder   3. Ganglion cyst of right foot     Plan:  #1: Plan for Arthroscopic subacromial decompression with distal clavicle excision, mini open rotator cuff repair possible dermis span patch.  Also, excision of ganglion cyst of the right lateral at the proximal fourth and fifth metatarsal   Orders:  No orders of the defined types were placed in this encounter.  No orders of the defined types were placed in this encounter.     Procedures: No procedures performed   Clinical Data: No additional findings.   Subjective:    Chief Complaint  Patient presents with  . Right Shoulder - Pain    Michele Quinn is a 61 year old white female history of Right shoulder pain x over 1- 2  years, worsening x 1 week, difficulty sleeping, limited range of motion, weakness, Advil x 1 day, nothing working for pain, injury x 3-4 years ago.   She was apparently scheduled for arthroscopic surgery at the end of last year but apparently developed bronchitis had to cancel that and then also canceled because she had a trip coming up in January. She suffered the significantly over the past year but has waited till now cause over the past week she started developing severe pain in the shoulder. She had been using a 0 radius lawnmower and had been doing some raking. Her symptoms now pain with every motion as well as given aching pain issues tries to sleep. Seen today for evaluation.  She also has a ganglion cyst of the right foot which is  symptomatic  Review of Systems  Constitutional: Negative.   HENT: Negative.   Respiratory:       Sleep apnea and seasonal asthma  Cardiovascular:       Hypertension  Gastrointestinal: Negative.   Genitourinary: Negative.   Skin: Negative.   Neurological: Negative.   Hematological: Negative.   Psychiatric/Behavioral: Negative.      Objective: Vital Signs: BP 133/70 (BP Location: Left Arm, Patient Position: Sitting, Cuff Size: Normal)   Pulse 74   Resp 14   Ht 5' 4.5" (1.638 m)   Wt 200 lb (90.7 kg)   BMI 33.80 kg/m   Physical Exam  Constitutional: She is oriented to person, place, and time. She appears well-developed and well-nourished.  HENT:  Head: Normocephalic and atraumatic.  Eyes: EOM are normal.  Pupils are equal, round, and reactive to light.  Neck:  No carotid bruits  Pulmonary/Chest: Effort normal.  Neurological: She is alert and oriented to person, place, and time.  Skin: Skin is warm and dry.  Psychiatric: She has a normal mood and affect. Her behavior is normal. Judgment and thought content normal.    Right Ankle Exam  Right ankle exam is normal except  Cystic lesion over the small fourth fifth metatarsal area multilobulated   Right Shoulder Exam   Tenderness  The patient is experiencing tenderness in the acromioclavicular joint, acromion and biceps tendon.  Muscle Strength  Abduction: 3/5  Internal Rotation: 4/5  External Rotation: 3/5  Supraspinatus: 3/5  Subscapularis: 4/5   Tests  Apprehension: positive Impingement: positive  Other  Sensation: normal Pulse: present  Comments:  Decreased range of motion secondary to pain throughout the shoulder.     Specialty Comments:  No specialty comments available.  Imaging: CLINICAL DATA: Right shoulder pain for 3 years. Heard a pop pulling herself up. Limited range of motion.  EXAM: MRI OF THE RIGHT SHOULDER WITHOUT CONTRAST  TECHNIQUE: Multiplanar,  multisequence MR imaging of the shoulder was performed. No intravenous contrast was administered.  COMPARISON: None.  FINDINGS: Rotator cuff: Severe tendinosis of the supraspinatus tendon with a small insertional interstitial tear. Severe tendinosis of the infraspinatus tendon with a possible small partial thickness insertional bursal surface tear (image 6/series 5). Teres minor tendon is intact. Subscapularis tendon is intact.  Muscles: No atrophy or fatty replacement of nor abnormal signal within, the muscles of the rotator cuff.  Biceps long head: Intact.  Acromioclavicular Joint: Normal acromioclavicular joint. Type II acromion. Incidental note made of an os acromiale with mild edema adjacent to the synchondrosis which may reflect instability. Small amount of subacromial/subdeltoid bursal fluid.  Glenohumeral Joint: No significant joint effusion. No focal chondral defect. Normal alignment.  Labrum: Grossly intact, but evaluation is limited by lack of intraarticular fluid.  Bones: No focal marrow signal abnormality. No acute fracture or dislocation. No aggressive osseous lesion.  IMPRESSION: 1. Severe tendinosis of the supraspinatus tendon with a small insertional interstitial tear. 2. Severe tendinosis of the infraspinatus tendon with a possible small partial thickness insertional bursal surface tear (image 6/series 5). 3. Mild subacromial/subdeltoid bursitis. 4. 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    PMFS History:     Patient Active Problem List   Diagnosis Date Noted  . Rotator cuff tear, non-traumatic, right 08/29/2016  . Benign paroxysmal positional vertigo 11/17/2013  . Bronchitis 11/22/2011       Past Medical History:  Diagnosis Date  . Asthma   . Degenerative disc disease, cervical   . GERD (gastroesophageal reflux disease)   . Glaucoma   . Hyperlipidemia   . Hypertension    . Insomnia   . Peptic ulcer disease   . Pneumonia   . Sleep apnea 2015         Family History  Problem Relation Age of Onset  . Lymphoma Mother   . Endometrial cancer Mother   . Hypertension Mother   . Heart attack Mother   . COPD Father   . Hypertension Father   . Diabetes Father   . Macular degeneration Father   . Heart failure Father   . Colon polyps Father   . Dementia Father   . Hyperlipidemia Sister   . Hypertension Sister   . Diabetes Brother   . Diabetes Brother  Past Surgical History:  Procedure Laterality Date  . CERVICAL FUSION    . FOOT NEUROMA SURGERY Left   . FOOT SURGERY Left   . HAND SURGERY    . reduction mammoplasty    . SALIVARY GLAND SURGERY Right 1992   Social History      Occupational History  . Not on file.        Social History Main Topics  . Smoking status: Never Smoker  . Smokeless tobacco: Never Used  . Alcohol use 1.2 - 1.8 oz/week    2 - 3 Glasses of wine per week  . Drug use: No  . Sexual activity: Not on file   Oris Drone. Aleda Grana [facility] [phone]  10/01/2016 10:01 AM

## 2016-09-30 MED ORDER — SODIUM CHLORIDE 0.9 % IV SOLN: 75.0000 mL/h | INTRAVENOUS | Status: DC

## 2016-09-30 MED ORDER — ACETAMINOPHEN 10 MG/ML IV SOLN
1000.0000 mg | Freq: Once | INTRAVENOUS | Status: AC
  Administered 2016-10-01: 1000 mg via INTRAVENOUS
  Filled 2016-09-30: qty 100

## 2016-09-30 MED ORDER — CEFAZOLIN SODIUM-DEXTROSE 2-4 GM/100ML-% IV SOLN
2.0000 g | INTRAVENOUS | Status: AC
  Administered 2016-10-01: 2 g via INTRAVENOUS
  Filled 2016-09-30: qty 100

## 2016-10-01 ENCOUNTER — Ambulatory Visit (HOSPITAL_COMMUNITY): Payer: Commercial Managed Care - HMO | Admitting: Certified Registered"

## 2016-10-01 ENCOUNTER — Encounter (HOSPITAL_COMMUNITY): Payer: Self-pay | Admitting: Certified Registered"

## 2016-10-01 ENCOUNTER — Ambulatory Visit (HOSPITAL_COMMUNITY)
Admission: RE | Admit: 2016-10-01 | Discharge: 2016-10-01 | Disposition: A | Discharge status: Home / Self Care | Payer: Commercial Managed Care - HMO | Source: Ambulatory Visit | Attending: Orthopaedic Surgery | Admitting: Orthopaedic Surgery

## 2016-10-01 ENCOUNTER — Encounter (HOSPITAL_COMMUNITY)
Admission: RE | Disposition: A | Discharge status: Home / Self Care | Payer: Self-pay | Source: Ambulatory Visit | Attending: Orthopaedic Surgery

## 2016-10-01 ENCOUNTER — Ambulatory Visit (HOSPITAL_COMMUNITY): Payer: Commercial Managed Care - HMO | Admitting: Vascular Surgery

## 2016-10-01 DIAGNOSIS — M67471 Ganglion, right ankle and foot: Secondary | ICD-10-CM | POA: Insufficient documentation

## 2016-10-01 DIAGNOSIS — M75121 Complete rotator cuff tear or rupture of right shoulder, not specified as traumatic: Secondary | ICD-10-CM | POA: Diagnosis not present

## 2016-10-01 DIAGNOSIS — E785 Hyperlipidemia, unspecified: Secondary | ICD-10-CM | POA: Insufficient documentation

## 2016-10-01 DIAGNOSIS — G4700 Insomnia, unspecified: Secondary | ICD-10-CM | POA: Diagnosis not present

## 2016-10-01 DIAGNOSIS — M75101 Unspecified rotator cuff tear or rupture of right shoulder, not specified as traumatic: Secondary | ICD-10-CM | POA: Diagnosis not present

## 2016-10-01 DIAGNOSIS — H409 Unspecified glaucoma: Secondary | ICD-10-CM | POA: Insufficient documentation

## 2016-10-01 DIAGNOSIS — K219 Gastro-esophageal reflux disease without esophagitis: Secondary | ICD-10-CM | POA: Diagnosis not present

## 2016-10-01 DIAGNOSIS — M65811 Other synovitis and tenosynovitis, right shoulder: Secondary | ICD-10-CM | POA: Diagnosis not present

## 2016-10-01 DIAGNOSIS — I10 Essential (primary) hypertension: Secondary | ICD-10-CM | POA: Insufficient documentation

## 2016-10-01 DIAGNOSIS — J45909 Unspecified asthma, uncomplicated: Secondary | ICD-10-CM | POA: Insufficient documentation

## 2016-10-01 DIAGNOSIS — M75111 Incomplete rotator cuff tear or rupture of right shoulder, not specified as traumatic: Secondary | ICD-10-CM | POA: Diagnosis not present

## 2016-10-01 DIAGNOSIS — M25811 Other specified joint disorders, right shoulder: Secondary | ICD-10-CM

## 2016-10-01 DIAGNOSIS — Z79899 Other long term (current) drug therapy: Secondary | ICD-10-CM | POA: Insufficient documentation

## 2016-10-01 DIAGNOSIS — M19011 Primary osteoarthritis, right shoulder: Secondary | ICD-10-CM | POA: Diagnosis not present

## 2016-10-01 DIAGNOSIS — G4730 Sleep apnea, unspecified: Secondary | ICD-10-CM | POA: Diagnosis not present

## 2016-10-01 DIAGNOSIS — G8918 Other acute postprocedural pain: Secondary | ICD-10-CM | POA: Diagnosis not present

## 2016-10-01 DIAGNOSIS — M7541 Impingement syndrome of right shoulder: Secondary | ICD-10-CM

## 2016-10-01 HISTORY — PX: SHOULDER ARTHROSCOPY WITH OPEN ROTATOR CUFF REPAIR AND DISTAL CLAVICLE ACROMINECTOMY: SHX5683

## 2016-10-01 HISTORY — PX: EAR CYST EXCISION: SHX22

## 2016-10-01 SURGERY — SHOULDER ARTHROSCOPY WITH OPEN ROTATOR CUFF REPAIR AND DISTAL CLAVICLE ACROMINECTOMY
Anesthesia: General | Site: Shoulder | Laterality: Right

## 2016-10-01 MED ORDER — SUGAMMADEX SODIUM 200 MG/2ML IV SOLN
INTRAVENOUS | Status: DC | PRN
Start: 2016-10-01 — End: 2016-10-01
  Administered 2016-10-01: 200 mg via INTRAVENOUS

## 2016-10-01 MED ORDER — ONDANSETRON HCL 4 MG/2ML IJ SOLN
INTRAMUSCULAR | Status: AC
  Filled 2016-10-01: qty 2

## 2016-10-01 MED ORDER — OXYCODONE-ACETAMINOPHEN 5-325 MG PO TABS
1.0000 | ORAL_TABLET | Freq: Once | ORAL | Status: AC
  Administered 2016-10-01: 1 via ORAL

## 2016-10-01 MED ORDER — PROPOFOL 10 MG/ML IV BOLUS
INTRAVENOUS | Status: DC | PRN
  Administered 2016-10-01: 40 mg via INTRAVENOUS
  Administered 2016-10-01: 170 mg via INTRAVENOUS

## 2016-10-01 MED ORDER — SODIUM CHLORIDE 0.9 % IR SOLN
Status: DC | PRN
  Administered 2016-10-01 (×4): 3000 mL

## 2016-10-01 MED ORDER — BUPIVACAINE-EPINEPHRINE (PF) 0.5% -1:200000 IJ SOLN
INTRAMUSCULAR | Status: DC | PRN
  Administered 2016-10-01: 25 mL via PERINEURAL

## 2016-10-01 MED ORDER — FENTANYL CITRATE (PF) 100 MCG/2ML IJ SOLN
INTRAMUSCULAR | Status: DC | PRN
Start: 2016-10-01 — End: 2016-10-01
  Administered 2016-10-01: 50 ug via INTRAVENOUS

## 2016-10-01 MED ORDER — EPHEDRINE 5 MG/ML INJ
INTRAVENOUS | Status: AC
  Filled 2016-10-01: qty 10

## 2016-10-01 MED ORDER — SCOPOLAMINE 1 MG/3DAYS TD PT72
MEDICATED_PATCH | TRANSDERMAL | Status: AC
  Administered 2016-10-01: 1.5 mg
  Filled 2016-10-01: qty 1

## 2016-10-01 MED ORDER — PHENYLEPHRINE HCL 10 MG/ML IJ SOLN
INTRAMUSCULAR | Status: DC | PRN
  Administered 2016-10-01: 20 ug/min via INTRAVENOUS

## 2016-10-01 MED ORDER — FENTANYL CITRATE (PF) 100 MCG/2ML IJ SOLN
INTRAMUSCULAR | Status: AC
  Administered 2016-10-01: 100 ug
  Filled 2016-10-01: qty 2

## 2016-10-01 MED ORDER — ROCURONIUM BROMIDE 100 MG/10ML IV SOLN
INTRAVENOUS | Status: DC | PRN
  Administered 2016-10-01: 50 mg via INTRAVENOUS

## 2016-10-01 MED ORDER — MIDAZOLAM HCL 2 MG/2ML IJ SOLN
INTRAMUSCULAR | Status: AC
  Filled 2016-10-01: qty 2

## 2016-10-01 MED ORDER — 0.9 % SODIUM CHLORIDE (POUR BTL) OPTIME
TOPICAL | Status: DC | PRN
  Administered 2016-10-01: 1000 mL

## 2016-10-01 MED ORDER — FENTANYL CITRATE (PF) 100 MCG/2ML IJ SOLN
INTRAMUSCULAR | Status: AC
  Filled 2016-10-01: qty 2

## 2016-10-01 MED ORDER — CHLORHEXIDINE GLUCONATE 4 % EX LIQD: 60.0000 mL | Freq: Once | CUTANEOUS | Status: DC

## 2016-10-01 MED ORDER — ONDANSETRON HCL 4 MG/2ML IJ SOLN
INTRAMUSCULAR | Status: DC | PRN
  Administered 2016-10-01: 4 mg via INTRAVENOUS

## 2016-10-01 MED ORDER — LACTATED RINGERS IV SOLN
INTRAVENOUS | Status: DC
  Administered 2016-10-01 (×2): via INTRAVENOUS

## 2016-10-01 MED ORDER — EPHEDRINE SULFATE 50 MG/ML IJ SOLN
INTRAMUSCULAR | Status: DC | PRN
  Administered 2016-10-01 (×2): 10 mg via INTRAVENOUS

## 2016-10-01 MED ORDER — LIDOCAINE HCL (CARDIAC) 20 MG/ML IV SOLN
INTRAVENOUS | Status: DC | PRN
  Administered 2016-10-01: 60 mg via INTRAVENOUS

## 2016-10-01 MED ORDER — ROCURONIUM BROMIDE 50 MG/5ML IV SOSY
PREFILLED_SYRINGE | INTRAVENOUS | Status: AC
  Filled 2016-10-01: qty 5

## 2016-10-01 MED ORDER — HYDROMORPHONE HCL 1 MG/ML IJ SOLN: 0.2500 mg | INTRAMUSCULAR | Status: DC | PRN

## 2016-10-01 MED ORDER — PROPOFOL 10 MG/ML IV BOLUS
INTRAVENOUS | Status: AC
  Filled 2016-10-01: qty 20

## 2016-10-01 MED ORDER — MIDAZOLAM HCL 2 MG/2ML IJ SOLN
INTRAMUSCULAR | Status: AC
  Administered 2016-10-01: 1.5 mg
  Filled 2016-10-01: qty 2

## 2016-10-01 MED ORDER — OXYCODONE-ACETAMINOPHEN 5-325 MG PO TABS: 1.0000 | ORAL_TABLET | ORAL | 0 refills | Status: AC | PRN

## 2016-10-01 MED ORDER — PROMETHAZINE HCL 25 MG/ML IJ SOLN: 6.2500 mg | INTRAMUSCULAR | Status: DC | PRN

## 2016-10-01 MED ORDER — OXYCODONE-ACETAMINOPHEN 5-325 MG PO TABS
ORAL_TABLET | ORAL | Status: AC
  Administered 2016-10-01: 1 via ORAL
  Filled 2016-10-01: qty 1

## 2016-10-01 MED ORDER — SUGAMMADEX SODIUM 200 MG/2ML IV SOLN
INTRAVENOUS | Status: AC
  Filled 2016-10-01: qty 2

## 2016-10-01 MED ORDER — LIDOCAINE 2% (20 MG/ML) 5 ML SYRINGE
INTRAMUSCULAR | Status: AC
  Filled 2016-10-01: qty 5

## 2016-10-01 SURGICAL SUPPLY — 77 items
AID PSTN UNV HD RSTRNT DISP (MISCELLANEOUS) ×2
ANCH SUT KNTLS STRL SHLDR SYS (Anchor) ×2 IMPLANT
ANCHOR PEEK ALL THREAD (Anchor) ×2 IMPLANT
ANCHOR SUT QUATTRO KNTLS 4.5 (Anchor) ×1 IMPLANT
APL SKNCLS STERI-STRIP NONHPOA (GAUZE/BANDAGES/DRESSINGS) ×2
BANDAGE ACE 3X5.8 VEL STRL LF (GAUZE/BANDAGES/DRESSINGS) ×1 IMPLANT
BANDAGE ESMARK 6X9 LF (GAUZE/BANDAGES/DRESSINGS) ×1 IMPLANT
BENZOIN TINCTURE PRP APPL 2/3 (GAUZE/BANDAGES/DRESSINGS) ×3 IMPLANT
BLADE GREAT WHITE 4.2 (BLADE) IMPLANT
BLADE SURG 11 STRL SS (BLADE) ×3 IMPLANT
BNDG CMPR 9X6 STRL LF SNTH (GAUZE/BANDAGES/DRESSINGS) ×2
BNDG ESMARK 6X9 LF (GAUZE/BANDAGES/DRESSINGS) ×3
BNDG GAUZE ELAST 4 BULKY (GAUZE/BANDAGES/DRESSINGS) ×2 IMPLANT
BUR OVAL 6.0 (BURR) ×3 IMPLANT
CANNULA ACUFLEX KIT 5X76 (CANNULA) ×3 IMPLANT
CLSR STERI-STRIP ANTIMIC 1/2X4 (GAUZE/BANDAGES/DRESSINGS) ×4 IMPLANT
COVER SURGICAL LIGHT HANDLE (MISCELLANEOUS) ×3 IMPLANT
DRAPE SHOULDER BEACH CHAIR (DRAPES) ×3 IMPLANT
DRSG MEPILEX BORDER 4X8 (GAUZE/BANDAGES/DRESSINGS) ×2 IMPLANT
DRSG PAD ABDOMINAL 8X10 ST (GAUZE/BANDAGES/DRESSINGS) ×3 IMPLANT
DURAPREP 26ML APPLICATOR (WOUND CARE) ×4 IMPLANT
ELECT NDL TIP 2.8 STRL (NEEDLE) ×2 IMPLANT
ELECT NEEDLE TIP 2.8 STRL (NEEDLE) ×3 IMPLANT
ELECT REM PT RETURN 9FT ADLT (ELECTROSURGICAL) ×3
ELECTRODE REM PT RTRN 9FT ADLT (ELECTROSURGICAL) ×2 IMPLANT
GAUZE SPONGE 4X4 12PLY STRL (GAUZE/BANDAGES/DRESSINGS) ×3 IMPLANT
GLOVE BIOGEL PI IND STRL 7.5 (GLOVE) IMPLANT
GLOVE BIOGEL PI IND STRL 8 (GLOVE) ×3 IMPLANT
GLOVE BIOGEL PI IND STRL 8.5 (GLOVE) ×2 IMPLANT
GLOVE BIOGEL PI INDICATOR 7.5 (GLOVE) ×1
GLOVE BIOGEL PI INDICATOR 8 (GLOVE) ×2
GLOVE BIOGEL PI INDICATOR 8.5 (GLOVE) ×2
GLOVE ECLIPSE 7.5 STRL STRAW (GLOVE) ×1 IMPLANT
GLOVE ECLIPSE 8.0 STRL XLNG CF (GLOVE) ×5 IMPLANT
GLOVE SURG ORTHO 8.5 STRL (GLOVE) ×6 IMPLANT
GOWN STRL REUS W/ TWL LRG LVL3 (GOWN DISPOSABLE) ×4 IMPLANT
GOWN STRL REUS W/TWL 2XL LVL3 (GOWN DISPOSABLE) ×3 IMPLANT
GOWN STRL REUS W/TWL LRG LVL3 (GOWN DISPOSABLE) ×6
IMMOBILIZER SHOULDER FOAM XLGE (SOFTGOODS) ×1 IMPLANT
KIT ROOM TURNOVER OR (KITS) ×3 IMPLANT
MANIFOLD NEPTUNE II (INSTRUMENTS) ×3 IMPLANT
NDL SPNL 18GX3.5 QUINCKE PK (NEEDLE) ×2 IMPLANT
NDL SUT 6 .5 CRC .975X.05 MAYO (NEEDLE) IMPLANT
NEEDLE 22X1 1/2 (OR ONLY) (NEEDLE) IMPLANT
NEEDLE MAYO TAPER (NEEDLE)
NEEDLE SPNL 18GX3.5 QUINCKE PK (NEEDLE) ×3 IMPLANT
NS IRRIG 1000ML POUR BTL (IV SOLUTION) ×3 IMPLANT
PACK ORTHO EXTREMITY (CUSTOM PROCEDURE TRAY) ×1 IMPLANT
PACK SHOULDER (CUSTOM PROCEDURE TRAY) ×3 IMPLANT
PAD ARMBOARD 7.5X6 YLW CONV (MISCELLANEOUS) ×6 IMPLANT
PROBE BIPOLAR ATHRO 135MM 90D (MISCELLANEOUS) ×1 IMPLANT
RESTRAINT HEAD UNIVERSAL NS (MISCELLANEOUS) ×2 IMPLANT
SET ARTHROSCOPY TUBING (MISCELLANEOUS) ×3
SET ARTHROSCOPY TUBING LN (MISCELLANEOUS) ×2 IMPLANT
SLEEVE SURGEON STRL (DRAPES) ×2 IMPLANT
SLING ARM IMMOBILIZER LRG (SOFTGOODS) ×3 IMPLANT
SLING ARM IMMOBILIZER MED (SOFTGOODS) IMPLANT
SPONGE GAUZE 4X4 12PLY STER LF (GAUZE/BANDAGES/DRESSINGS) ×3 IMPLANT
SPONGE LAP 4X18 X RAY DECT (DISPOSABLE) ×3 IMPLANT
STRIP CLOSURE SKIN 1/2X4 (GAUZE/BANDAGES/DRESSINGS) ×3 IMPLANT
SUCTION FRAZIER TIP 10 FR DISP (SUCTIONS) ×2 IMPLANT
SUT ETHIBOND 0 MO6 C/R (SUTURE) IMPLANT
SUT ETHIBOND 2 0 SH (SUTURE) IMPLANT
SUT ETHIBOND 2 OS 4 DA (SUTURE) ×3 IMPLANT
SUT ETHILON 4 0 PS 2 18 (SUTURE) ×1 IMPLANT
SUT MNCRL AB 3-0 PS2 18 (SUTURE) ×3 IMPLANT
SUT MNCRL AB 4-0 PS2 18 (SUTURE) ×1 IMPLANT
SUT VIC AB 0 CT1 27 (SUTURE) ×6
SUT VIC AB 0 CT1 27XBRD ANBCTR (SUTURE) ×2 IMPLANT
SUT VIC AB 2-0 CT1 27 (SUTURE) ×3
SUT VIC AB 2-0 CT1 TAPERPNT 27 (SUTURE) ×1 IMPLANT
SUT VICRYL 0 UR6 27IN ABS (SUTURE) ×4 IMPLANT
SYR CONTROL 10ML LL (SYRINGE) ×1 IMPLANT
TOWEL OR 17X24 6PK STRL BLUE (TOWEL DISPOSABLE) ×3 IMPLANT
TOWEL OR 17X26 10 PK STRL BLUE (TOWEL DISPOSABLE) ×3 IMPLANT
TUBE CONNECTING 12X1/4 (SUCTIONS) ×6 IMPLANT
WATER STERILE IRR 1000ML POUR (IV SOLUTION) ×3 IMPLANT

## 2016-10-01 NOTE — Progress Notes (Signed)
Orthopedic Tech Progress Note Patient Details:  Michele Quinn 1956-01-28 [id number]  Ortho Devices Type of Ortho Device: Postop shoe/boot Ortho Device/Splint Interventions: Application   [name] 10/01/2016, 2:32 PM

## 2016-10-01 NOTE — H&P (Signed)
The recent History & Physical has been reviewed. I have personally examined the patient today. There is no interval change to the documented History & Physical. The patient would like to proceed with the procedure.  Michele Quinn W [name] 10/01/2016,  9:37 AM

## 2016-10-01 NOTE — Anesthesia Procedure Notes (Signed)
Procedure Name: Intubation Date/Time: 10/01/2016 10:23 AM Performed by: Sampson Si E Pre-anesthesia Checklist: Patient identified, Emergency Drugs available, Suction available and Patient being monitored Patient Re-evaluated:Patient Re-evaluated prior to inductionOxygen Delivery Method: Circle System Utilized Preoxygenation: Pre-oxygenation with 100% oxygen Intubation Type: IV induction Ventilation: Mask ventilation without difficulty Laryngoscope Size: Mac and 3 Grade View: Grade II Tube type: Oral Number of attempts: 2 Airway Equipment and Method: Stylet and Oral airway Placement Confirmation: ETT inserted through vocal cords under direct vision,  positive ETCO2 and breath sounds checked- equal and bilateral Secured at: 22 cm Tube secured with: Tape Dental Injury: Teeth and Oropharynx as per pre-operative assessment

## 2016-10-01 NOTE — Anesthesia Postprocedure Evaluation (Addendum)
Anesthesia Post Note  Patient: Lonell GrandchildCynthia H Mcginley  Procedure(s) Performed: Procedure(s) (LRB): SHOULDER ARTHROSCOPY WITH SUBACROMIAL DECOMPRESSION, MINI-OPEN ROTATOR CUFF REPAIR AND DISTAL CLAVICLE RESECTION (Right) EXCISION GANGLION CYST RIGHT FOOT LATERAL  (Right)  Patient location during evaluation: PACU Anesthesia Type: General and Regional Level of consciousness: awake and oriented Pain management: pain level controlled Vital Signs Assessment: post-procedure vital signs reviewed and stable Respiratory status: spontaneous breathing, nonlabored ventilation, respiratory function stable and patient connected to nasal cannula oxygen Cardiovascular status: blood pressure returned to baseline and stable Postop Assessment: no signs of nausea or vomiting Anesthetic complications: no       Last Vitals:  Vitals:   10/01/16 1337 10/01/16 1351  BP: 114/62 (!) 108/55  Pulse: (!) 53   Resp: (!) 7 18  Temp: 36.5 C 36.1 C    Last Pain:  Vitals:   10/01/16 1305  TempSrc:   PainSc: 5                  Dell Briner,JAMES TERRILL

## 2016-10-01 NOTE — Anesthesia Procedure Notes (Addendum)
Anesthesia Regional Block:  Interscalene brachial plexus block  Pre-Anesthetic Checklist: ,, timeout performed, Correct Patient, Correct Site, Correct Laterality, Correct Procedure, Correct Position, site marked, Risks and benefits discussed,  Surgical consent,  Pre-op evaluation,  At surgeon's request and post-op pain management  Laterality: Upper and Right  Prep: Betadine, chloraprep, alcohol swabs       Needles:  Injection technique: Single-shot  Needle Type: Echogenic Needle     Needle Length: 4cm 4 cm Needle Gauge: 21 and 21 G  Needle insertion depth: 3 cm   Additional Needles:  Procedures: ultrasound guided (picture in chart) and nerve stimulator Interscalene brachial plexus block  Nerve Stimulator or Paresthesia:  Response: Twitch elicited, 0.5 mA, 0.3 ms,   Additional Responses:   Narrative:  Start time: 10/01/2016 9:54 AM End time: 10/01/2016 9:24 AM Injection made incrementally with aspirations every 5 mL.  Performed by: Personally  Anesthesiologist: Kito Cuffe  Additional Notes: Block assessed prior to start of surgery

## 2016-10-01 NOTE — Transfer of Care (Signed)
Immediate Anesthesia Transfer of Care Note  Patient: Michele Quinn  Procedure(s) Performed: Procedure(s): SHOULDER ARTHROSCOPY WITH SUBACROMIAL DECOMPRESSION, MINI-OPEN ROTATOR CUFF REPAIR AND DISTAL CLAVICLE RESECTION (Right) EXCISION GANGLION CYST RIGHT FOOT LATERAL  (Right)  Patient Location: PACU  Anesthesia Type:General  Level of Consciousness: awake and patient cooperative  Airway & Oxygen Therapy: Patient Spontanous Breathing and Patient connected to nasal cannula oxygen  Post-op Assessment: Report given to RN  Post vital signs: Reviewed and stable  Last Vitals:  Vitals:   10/01/16 0915 10/01/16 0935  BP:    Pulse: 69 89  Resp: 14 17  Temp:      Last Pain:  Vitals:   10/01/16 0818  TempSrc:   PainSc: 3       Patients Stated Pain Goal: 3 (10/01/16 0818)  Complications: No apparent anesthesia complications

## 2016-10-01 NOTE — Anesthesia Preprocedure Evaluation (Addendum)
Anesthesia Evaluation  Patient identified by MRN, date of birth, ID band Patient awake    Reviewed: Allergy & Precautions, NPO status , Patient's Chart, lab work & pertinent test results  History of Anesthesia Complications (+) PONV and history of anesthetic complications  Airway Mallampati: II  TM Distance: >3 FB Neck ROM: Full    Dental  (+) Teeth Intact, Dental Advisory Given   Pulmonary asthma , sleep apnea ,    breath sounds clear to auscultation       Cardiovascular hypertension,  Rhythm:Regular Rate:Normal     Neuro/Psych    GI/Hepatic Neg liver ROS, PUD, GERD  ,  Endo/Other  negative endocrine ROS  Renal/GU negative Renal ROS     Musculoskeletal  (+) Arthritis ,   Abdominal   Peds  Hematology   Anesthesia Other Findings   Reproductive/Obstetrics                            Anesthesia Physical Anesthesia Plan  ASA: II  Anesthesia Plan: General   Post-op Pain Management:  Regional for Post-op pain   Induction: Intravenous  Airway Management Planned: Oral ETT  Additional Equipment:   Intra-op Plan:   Post-operative Plan: Extubation in OR  Informed Consent: I have reviewed the patients History and Physical, chart, labs and discussed the procedure including the risks, benefits and alternatives for the proposed anesthesia with the patient or authorized representative who has indicated his/her understanding and acceptance.   Dental advisory given  Plan Discussed with: CRNA  Anesthesia Plan Comments:        Anesthesia Quick Evaluation

## 2016-10-01 NOTE — Op Note (Signed)
PATIENT ID:      Michele Quinn  MRN:     [id number] DOB/AGE:    July 24, 1956 / 61 y.o.       OPERATIVE REPORT    DATE OF PROCEDURE:  10/01/2016       PREOPERATIVE DIAGNOSIS:   RIGHT SHOULDER IMPINGEMENT SYNDROME, ROTATOR CUFF TEAR, AC ARTHRITIS, RIGHT FOOT GANGLION CYST                                                       Estimated body mass index is 36.05 kg/m as calculated from the following:   Height as of 09/25/16: 5' 4.5" (1.638 m).   Weight as of 09/25/16: 213 lb 4.8 oz (96.8 kg).     POSTOPERATIVE DIAGNOSIS:   RIGHT SHOULDER IMPINGEMENT SYNDROME, ROTATOR CUFF TEAR, AC   ARTHRITIS, RIGHT FOOT GANGLION CYST                                                                   Estimated body mass index is 36.05 kg/m as calculated from the following:   Height as of 09/25/16: 5' 4.5" (1.638 m).   Weight as of 09/25/16: 213 lb 4.8 oz (96.8 kg).     PROCEDURE:  Procedure(s):RIGHT SHOULDER ARTHROSCOPY WITH SUBACROMIAL DECOMPRESSION, ARTHROSCOPIC DISTAL CLAVICLE RESECTION AND MINI OPEN ROTATOR CUFF TEAR REPAIR EXCISION GANGLION CYST RIGHT FOOT LATERAL       SURGEON:  Norlene CampbellPeter Mykala Mccready, MD    ASSISTANT:   Jacqualine CodeBrian Petrarca, PA-C   (Present and scrubbed throughout the case, critical for assistance with exposure, retraction, instrumentation, and closure.)          ANESTHESIA: regional and general     DRAINS: none :      TOURNIQUET TIME: * No tourniquets in log *    COMPLICATIONS:  None   CONDITION:  stable  PROCEDURE IN DETAIL: [id number]   Michele Quinn 10/01/2016, 12:10 PM

## 2016-10-02 ENCOUNTER — Encounter (HOSPITAL_COMMUNITY): Payer: Self-pay | Admitting: Orthopaedic Surgery

## 2016-10-02 NOTE — Op Note (Signed)
NAME:  Michele Quinn, Michele Quinn                  ACCOUNT NO.:  MEDICAL RECORD NO.:  [id number]  LOCATION:                                 FACILITY:  PHYSICIAN:  Vonna Kotyk. Durward Fortes, M.D.    DATE OF BIRTH:  DATE OF PROCEDURE:  10/01/2016 DATE OF DISCHARGE:                              OPERATIVE REPORT   PREOPERATIVE DIAGNOSES: 1. Impingement syndrome, right shoulder with degenerative arthrosis,     acromioclavicular joint, and partial versus full rotator cuff tear. 2. Ganglion cyst, dorsum right foot.  POSTOPERATIVE DIAGNOSES: 1. Near full-thickness rotator cuff tear of supraspinatus, right     shoulder. 2. Impingement syndrome, right shoulder. 3. Degenerative arthrosis, acromioclavicular joint. 4. Ganglion dorsum, right foot.  PROCEDURE: 1. Diagnostic arthroscopy, right shoulder with debridement of high-     grade partial tear of rotator cuff and synovectomy. 2. Arthroscopic subacromial decompression. 3. Arthroscopic distal clavicle resection. 4. Mini open rotator cuff tear repair. 5. Excision of ganglion cyst, dorsum right foot.  SURGEON:  Vonna Kotyk. Durward Fortes, M.D.  ASSISTANT:  Aaron Edelman D. Petrarca, PA-C.  ANESTHESIA:  General with interscalene nerve block.  COMPLICATIONS:  None.  HISTORY:  A 61 year old female who has been experiencing problem with the right shoulder for well over a year.  She has had subacromial cortisone injections and time anti-inflammatory medicines with persistent pain to the point of compromise, particularly with overhead activity.  She had an MRI scan in November 2016 demonstrating impingement and os acromiale, and a partial tear of the supraspinatus tendon.  She continues to have pain to the point of compromise with a suspicion that she may have a higher grade or full thickness rotator cuff tear.  In addition, she has developed a ganglion in the dorsum of the right foot that she wishes to have excised.  DESCRIPTION OF PROCEDURE:  Michele Quinn was met in  the holding area with family member.  The right shoulder and right foot were identified as appropriate operative sites and marked accordingly.  She did receive a preoperative interscalene nerve block per Anesthesia.  The patient was then transported to room #7.  She was placed under general anesthesia without difficulty.  Using the shoulder flame frame, the patient was then placed in a semi-sitting position.  The right shoulder was then examined without evidence of instability or adhesive capsulitis.  Initial procedure was performed on the right shoulder.  The shoulder was prepped with chlorhexidine scrub and DuraPrep x2 from the base of the neck circumferentially below the elbow.  Sterile draping was performed. She was administered 2 g of Ancef.  After sterile draping, the marking pen was used to outline the [facility] joint the coracoid and the acromion at a point of fingerbreadth posterior and medial to the posterior angle acromion, a small stab wound was made and the arthroscope easily placed into the shoulder joint.  Diagnostic arthroscopy revealed minimal synovitis.  The biceps was intact.  Subscapularis was intact.  I did not see any appreciable chondromalacia of the humeral head or glenoid. There were no loose bodies.  There was some fraying of the anterior labrum.  A second portal was established anterior to the rib cannula.  I did debride the labrum.  There was an obvious high-grade partial- thickness tear of the supraspinatus.  I debrided this as well, but felt that it was very close to full thickness and felt that it should be repaired.  The arthroscope was then placed in subacromial space posteriorly, a cannula in the subacromial space anteriorly, and a third portal established in lateral subacromial space and arthroscopic subacromial decompression was performed.  There were obvious anterior and lateral overhang of the acromion.  There was an unstable portion of the os acromiale  that appeared to be a type 2.  I debrided the majority of the acromion so that I did feel like it was nearly as unstable and showing no evidence of any further impingement. There were also degenerative changes and synovitis about the [facility] joint. A distal clavicle resection was performed with a nice flat resection using the same 6 mm bur.  A mini open rotator cuff tear repair was then performed.  About an inch incision was made over the anterior aspect of the shoulder.  A raphe in the deltoid fascia was identified and incised.  Subacromial space was then entered.  There were still some remaining bursal material that I resected.  I did not see a full-thickness tear, but there was a large bubble about a cm in diameter lateral to the biceps groove consistent with her high-grade partial tear.  This was incised transversely and then I repaired it with a PEEK anchor substituted with a Quattro anchor further distally.  I had a nice resection of the acromion by palpation without evidence of further impingement and the cuff appeared to be nicely sutured in line with the humeral head.  The wound was then irrigated with saline solution.  The deltoid fascia closed with a running 0-Vicryl, subcu with 3-0 Monocryl, and skin closed with Steri-Strips over benzoin.  Sterile bulky dressing was applied.  We then prepped the right foot with DuraPrep x2 with sterile draping. We re-gowned and re-gloved.  I Esmarched the right foot protecting neurovascular bundle with 4 x 4 gauze.  The ganglion was identified near the base of the 5th metatarsal.  It appeared to be multiloculated.  I made a transverse incision in line with the ganglion and the 5th metatarsal and via sharp dissection, carried down to the subcutaneous tissue.  Then via blunt dissection, I elevated small veins from the cyst.  The cyst was incised.  The ganglionic fluid was easily removed, it was clear.  I traced this down below the fascia to the  level of the 5th metatarsal joint where it appeared to have originated.  All the ganglion cysts visualized were removed and then I made a small window into the 5th tarsometatarsal capsule and retrieved further ganglion fluid.  The wound was then irrigated.  I did not see any further ganglion material.  The subcu was closed with Monocryl, Steri-Strips over benzoin.  Sterile bulky dressing was applied followed by a wooden shoe.  We did inject 0.25% Marcaine without epinephrine.  Sterile bulky dressing was applied as mentioned.  The patient was then awoken, placed on the operating stretcher, returned to the postanesthesia recovery room in a satisfactory condition.     Vonna Kotyk. Durward Fortes, M.D.     PWW/MEDQ  D:  10/01/2016  T:  10/02/2016  Job:  [id number]

## 2016-10-07 ENCOUNTER — Encounter (INDEPENDENT_AMBULATORY_CARE_PROVIDER_SITE_OTHER): Payer: Self-pay | Admitting: Orthopaedic Surgery

## 2016-10-07 ENCOUNTER — Ambulatory Visit (INDEPENDENT_AMBULATORY_CARE_PROVIDER_SITE_OTHER): Payer: Commercial Managed Care - HMO | Admitting: Orthopaedic Surgery

## 2016-10-07 VITALS — BP 135/72 | HR 68 | Ht 64.5 in | Wt 210.0 lb

## 2016-10-07 DIAGNOSIS — M25511 Pain in right shoulder: Secondary | ICD-10-CM

## 2016-10-07 DIAGNOSIS — M79671 Pain in right foot: Secondary | ICD-10-CM

## 2016-10-07 DIAGNOSIS — G8929 Other chronic pain: Secondary | ICD-10-CM

## 2016-10-07 NOTE — Progress Notes (Signed)
Office Visit Note   Patient: Michele Quinn           Date of Birth: 1955/10/20           MRN: [id number] Visit Date: 10/07/2016              Requested by: Michele Funk, MD [address] [place], [place] [address] PCP: Michele Mountain, MD   Assessment & Plan: Visit Diagnoses: Status post right shoulder arthroscopic subacromial decompression, distal clavicle resection and mini open rotator cuff tear repair. Also had excision of a ganglion from the dorsum of her right foot  Plan: Change dressings, wooden shoe right foot, continue with sling to right upper extremity and begin passive range of motion. Office 2 weeks. We will ask therapy to see in her home as she lives alone.  Follow-Up Instructions: No Follow-up on file.   Orders:  No orders of the defined types were placed in this encounter.  No orders of the defined types were placed in this encounter.     Procedures: No procedures performed   Clinical Data: No additional findings.   Subjective: No chief complaint on file.   Surgery 10/01/2016:    SHOULDER ARTHROSCOPY WITH OPEN ROTATOR CUFF REPAIR AND DISTAL CLAVICLE ACROMINECTOMY   EXCISION BAKER'S CYST   Removed dressing on Right shoulder and the Right Foot      Michele Quinn is doing quite well with minimal use of analgesics. She is wearing the sling and has been comfortable. Surgery her right foot included excision of a large multilobed ganglion cyst that apparently arose from the base of the fifth metatarsal-tarsal joint  Review of Systems   Objective: Vital Signs: There were no vitals taken for this visit.  Physical Exam  Ortho Exam dressings removed from right shoulder and Steri-Strips reapplied. Wounds look fine without evidence of infection. Neurovascular exam is intact. Minimal ecchymosis.  Right foot incision is clean. Steri-Strips were reapplied. Skin intact neurovascular exam is intact.  Specialty Comments:  No specialty comments  available.  Imaging: No results found.   PMFS History: Patient Active Problem List   Diagnosis Date Noted  . Impingement syndrome of right shoulder 10/01/2016  . Nontraumatic incomplete tear of right rotator cuff 10/01/2016  . Os acromiale of right shoulder 10/01/2016  . Osteoarthritis of right AC (acromioclavicular) joint 10/01/2016  . Rotator cuff tear, non-traumatic, right 08/29/2016  . Benign paroxysmal positional vertigo 11/17/2013  . Bronchitis 11/22/2011   Past Medical History:  Diagnosis Date  . Asthma   . Corneal erosion of right eye    chronic abrasion due to dry eye  . Degenerative disc disease, cervical    osteoarthris  . Early cataract right  . Ganglion cyst of right foot   . GERD (gastroesophageal reflux disease)   . Glaucoma   . Headache   . Hyperlipidemia   . Hypertension   . Impingement syndrome of right shoulder    rotator cuff tear  . Insomnia   . Obesity (BMI 35.0-39.9 without comorbidity)   . Peptic ulcer disease   . Pneumonia   . PONV (postoperative nausea and vomiting)   . PVC's (premature ventricular contractions)    benign  . Sleep apnea 2015   wears CPAP    Family History  Problem Relation Age of Onset  . Lymphoma Mother   . Endometrial cancer Mother   . Hypertension Mother   . Heart attack Mother   . COPD Father   . Hypertension Father   .  Diabetes Father   . Macular degeneration Father   . Heart failure Father   . Colon polyps Father   . Dementia Father   . Hyperlipidemia Sister   . Hypertension Sister   . Diabetes Brother   . Diabetes Brother     Past Surgical History:  Procedure Laterality Date  . ABDOMINAL HYSTERECTOMY    . CERVICAL FUSION    . CERVICAL FUSION    . COLONOSCOPY    . EAR CYST EXCISION Right 10/01/2016   Procedure: EXCISION GANGLION CYST RIGHT FOOT LATERAL ;  Surgeon: Michele BatmanPeter W Katiejo Gilroy, MD;  Location: [facility];  Service: Orthopedics;  Laterality: Right;  . FOOT NEUROMA SURGERY Left   . FOOT SURGERY Left   .  HAND SURGERY    . reduction mammoplasty    . SALIVARY GLAND SURGERY Right 1992  . SHOULDER ARTHROSCOPY WITH OPEN ROTATOR CUFF REPAIR AND DISTAL CLAVICLE ACROMINECTOMY Right 10/01/2016   Procedure: SHOULDER ARTHROSCOPY WITH SUBACROMIAL DECOMPRESSION, MINI-OPEN ROTATOR CUFF REPAIR AND DISTAL CLAVICLE RESECTION;  Surgeon: Michele BatmanPeter W Liani Caris, MD;  Location: [facility] OR;  Service: Orthopedics;  Laterality: Right;   Social History   Occupational History  . Not on file.   Social History Main Topics  . Smoking status: Never Smoker  . Smokeless tobacco: Never Used  . Alcohol use 1.2 - 1.8 oz/week    2 - 3 Glasses of wine per week     Comment: occasional  . Drug use: No  . Sexual activity: Not on file

## 2016-10-14 ENCOUNTER — Inpatient Hospital Stay (INDEPENDENT_AMBULATORY_CARE_PROVIDER_SITE_OTHER): Payer: Commercial Managed Care - HMO | Admitting: Orthopaedic Surgery

## 2016-10-14 ENCOUNTER — Telehealth (INDEPENDENT_AMBULATORY_CARE_PROVIDER_SITE_OTHER): Payer: Self-pay | Admitting: Orthopaedic Surgery

## 2016-10-14 NOTE — Telephone Encounter (Signed)
Please advise 

## 2016-10-14 NOTE — Telephone Encounter (Signed)
Patient would like to get started with physical therapy this week for her shoulder and would like to do it outpatient. Patient is requesting the referral for physical therapy.

## 2016-10-15 ENCOUNTER — Other Ambulatory Visit (INDEPENDENT_AMBULATORY_CARE_PROVIDER_SITE_OTHER): Payer: Self-pay

## 2016-10-15 DIAGNOSIS — M7541 Impingement syndrome of right shoulder: Secondary | ICD-10-CM

## 2016-10-15 NOTE — Telephone Encounter (Signed)
Spoke with pt and [facility] [place]

## 2016-10-15 NOTE — Telephone Encounter (Signed)
Ok for OP PT --please call and see where she would like to go

## 2016-10-17 ENCOUNTER — Ambulatory Visit: Payer: 59 | Attending: Internal Medicine

## 2016-10-17 DIAGNOSIS — M25611 Stiffness of right shoulder, not elsewhere classified: Secondary | ICD-10-CM | POA: Diagnosis present

## 2016-10-17 DIAGNOSIS — M6281 Muscle weakness (generalized): Secondary | ICD-10-CM

## 2016-10-17 DIAGNOSIS — Z9889 Other specified postprocedural states: Secondary | ICD-10-CM | POA: Diagnosis present

## 2016-10-17 DIAGNOSIS — M25511 Pain in right shoulder: Secondary | ICD-10-CM | POA: Diagnosis not present

## 2016-10-17 DIAGNOSIS — G8929 Other chronic pain: Secondary | ICD-10-CM | POA: Insufficient documentation
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
## 2016-10-21 ENCOUNTER — Ambulatory Visit (INDEPENDENT_AMBULATORY_CARE_PROVIDER_SITE_OTHER): Payer: Commercial Managed Care - HMO | Admitting: Orthopaedic Surgery

## 2016-10-21 ENCOUNTER — Ambulatory Visit: Payer: 59 | Admitting: Physical Therapy

## 2016-10-21 ENCOUNTER — Encounter: Payer: Self-pay | Admitting: Physical Therapy

## 2016-10-21 ENCOUNTER — Encounter (INDEPENDENT_AMBULATORY_CARE_PROVIDER_SITE_OTHER): Payer: Self-pay | Admitting: Orthopaedic Surgery

## 2016-10-21 VITALS — BP 131/72 | HR 72 | Resp 14 | Ht 64.5 in | Wt 210.0 lb

## 2016-10-21 DIAGNOSIS — M25511 Pain in right shoulder: Secondary | ICD-10-CM

## 2016-10-21 DIAGNOSIS — M25611 Stiffness of right shoulder, not elsewhere classified: Secondary | ICD-10-CM

## 2016-10-21 DIAGNOSIS — G8929 Other chronic pain: Secondary | ICD-10-CM

## 2016-10-21 DIAGNOSIS — M6281 Muscle weakness (generalized): Secondary | ICD-10-CM

## 2016-10-21 DIAGNOSIS — Z9889 Other specified postprocedural states: Secondary | ICD-10-CM | POA: Diagnosis not present

## 2016-10-21 NOTE — Therapy (Signed)
[facility] [address] [place], [place], [address] Phone: [phone]   Fax:  [phone]  Physical Therapy Treatment  Patient Details  Name: Michele Quinn MRN: [id number] Date of Birth: 1955-10-20 Referring Provider: Norlene Campbell, MD  Encounter Date: 10/21/2016      PT End of Session - 10/21/16 1315    Visit Number 2   Number of Visits 24   Date for PT Re-Evaluation 01/10/17   PT Start Time 1019   PT Stop Time 1100   PT Time Calculation (min) 41 min   Activity Tolerance Patient tolerated treatment well   Behavior During Therapy [facility] for tasks assessed/performed      Past Medical History:  Diagnosis Date  . Asthma   . Corneal erosion of right eye    chronic abrasion due to dry eye  . Degenerative disc disease, cervical    osteoarthris  . Early cataract right  . Ganglion cyst of right foot   . GERD (gastroesophageal reflux disease)   . Glaucoma   . Headache   . Hyperlipidemia   . Hypertension   . Impingement syndrome of right shoulder    rotator cuff tear  . Insomnia   . Obesity (BMI 35.0-39.9 without comorbidity)   . Peptic ulcer disease   . Pneumonia   . PONV (postoperative nausea and vomiting)   . PVC's (premature ventricular contractions)    benign  . Sleep apnea 2015   wears CPAP    Past Surgical History:  Procedure Laterality Date  . ABDOMINAL HYSTERECTOMY    . CERVICAL FUSION    . CERVICAL FUSION    . COLONOSCOPY    . EAR CYST EXCISION Right 10/01/2016   Procedure: EXCISION GANGLION CYST RIGHT FOOT LATERAL ;  Surgeon: Valeria Batman, MD;  Location: [facility] OR;  Service: Orthopedics;  Laterality: Right;  . FOOT NEUROMA SURGERY Left   . FOOT SURGERY Left   . HAND SURGERY    . reduction mammoplasty    . SALIVARY GLAND SURGERY Right 1992  . SHOULDER ARTHROSCOPY WITH OPEN ROTATOR CUFF REPAIR AND DISTAL CLAVICLE ACROMINECTOMY Right 10/01/2016   Procedure: SHOULDER ARTHROSCOPY WITH SUBACROMIAL  DECOMPRESSION, MINI-OPEN ROTATOR CUFF REPAIR AND DISTAL CLAVICLE RESECTION;  Surgeon: Valeria Batman, MD;  Location: [facility] OR;  Service: Orthopedics;  Laterality: Right;    There were no vitals filed for this visit.      Subjective Assessment - 10/21/16 1200    Subjective Mild pain right now.  Wears sling in public.  She is adherent with her HEP.  She has been using Arm a little at home (AROM)                         [facility] Adult PT Treatment/Exercise - 10/21/16 0001      Shoulder Exercises: Supine   Horizontal ABduction AROM  cane,  HEP   Horizontal ABduction Limitations 8 X, small motions due to pain,  HEP   Flexion AAROM;Both;10 reps;Limitations  HEP,     Flexion Limitations HEP   Other Supine Exercises chest press, cane AA ROM,  HEP  cues for technique     Manual Therapy   Manual therapy comments light scar tisue mobilization. PROM within ROM guidelines.  Multiple preps.    Soft tissue work to shoulder girdle prior to ROM                PT Education - 10/21/16 1152  Education provided Yes   Education Details HEP,  precautions.  AAROM only.   Person(s) Educated Patient   Methods Explanation;Demonstration;Tactile cues;Verbal cues;Handout   Comprehension Verbalized understanding;Returned demonstration          PT Short Term Goals - 10/21/16 1320      PT SHORT TERM GOAL #1   Title she will bew independent with inital HEP   Time 3   Status On-going     PT SHORT TERM GOAL #2   Title She will be able to do activities limited by protocol  at week 6   Baseline week 3   Time 4   Period Weeks   Status On-going           PT Long Term Goals - 10/17/16 1445      PT LONG TERM GOAL #1   Title She will be independnet with all HEP issued   Time 12   Period Weeks   Status New     PT LONG TERM GOAL #2   Title She will report minimal pain with normal self care and be independent with this   Time 12   Period Weeks   Status New     PT  LONG TERM GOAL #3   Title She will return to home tasks as allowed by protocol at week 12   Time 12   Period Weeks   Status New     PT LONG TERM GOAL #4   Title She will be able to put items into upper canbinets  with min to no pain   Time 12   Period Weeks   Status New     PT LONG TERM GOAL #5   Title she will return to driving  with RT arm   Time 12   Period Weeks   Status New     Additional Long Term Goals   Additional Long Term Goals --     PT LONG TERM GOAL #6   Title she will return to some light to moderate outdoor activity with min to no pain.    Time 12   Period Weeks   Status New     PT LONG TERM GOAL #7   Title Active ROM equal to LT for independence with normal use of RT arm   Time 12   Period Weeks   Status New               Plan - 10/21/16 1316    Clinical Impression Statement Pain 1/10 at end of session.  Progress toward HEP goal with 3 supine cane exercises.  PROM flexion right arounf 120 degrees after manually softening tissue.  (ROM not fromally measured)   PT Next Visit Plan WEKK 3 OF PROTOCOL,  review cane   PT Home Exercise Plan Cane chest press, flexion,  horizontal abd.   Consulted and Agree with Plan of Care Patient      Patient will benefit from skilled therapeutic intervention in order to improve the following deficits and impairments:  Pain, Decreased strength, Postural dysfunction, Decreased activity tolerance, Impaired UE functional use, Increased muscle spasms, Decreased range of motion  Visit Diagnosis: Status post right rotator cuff repair  Chronic right shoulder pain  Stiffness of right shoulder, not elsewhere classified  Muscle weakness (generalized)     Problem List Patient Active Problem List   Diagnosis Date Noted  . Impingement syndrome of right shoulder 10/01/2016  . Nontraumatic incomplete tear of right rotator cuff 10/01/2016  .  Os acromiale of right shoulder 10/01/2016  . Osteoarthritis of right AC  (acromioclavicular) joint 10/01/2016  . Rotator cuff tear, non-traumatic, right 08/29/2016  . Benign paroxysmal positional vertigo 11/17/2013  . Bronchitis 11/22/2011    Jacaden Forbush PTA 10/21/2016, 1:22 PM  [facility] [address] [place], [place], [address] Phone: [phone]   Fax:  [phone]  Name: BERKLEE BATTEY MRN: [id number] Date of Birth: 17-Feb-1956

## 2016-10-21 NOTE — Patient Instructions (Signed)
From exercise drawer:  Supine cane chest press, flexion,  Horizontal  Abduction.  5-10 X each, hold 0 to 5 seconds each,  1-2  X a day.   Mat adjust frequency, reps and duration as needed.

## 2016-10-21 NOTE — Progress Notes (Signed)
Office Visit Note   Patient: Michele Quinn           Date of Birth: December 20, 1955           MRN: [id number] Visit Date: 10/21/2016              Requested by: Kirby Funk, MD [address] [place], [place] [address] PCP: Lillia Mountain, MD   Assessment & Plan: Visit Diagnoses: 3 weeks status post rotator cuff tear repair right shoulder. #2 excision of ganglion cyst right foot  Plan: Continue sling immobilization when out and about for 2-3 more weeks right upper extremity. Continue physical therapy .Marland Kitchen Weightbearing as tolerated right foot  Follow-Up Instructions: No Follow-up on file.   Orders:  No orders of the defined types were placed in this encounter.  No orders of the defined types were placed in this encounter.     Procedures: No procedures performed   Clinical Data: No additional findings.   Subjective: No chief complaint on file.   3 week Status post right shoulder arthroscopic subacromial decompression, distal clavicle resection and mini open rotator cuff tear repair. Also had excision of a ganglion from the dorsum of her right foot  Pt states no pain in foot, puts bandaid on foot so shoes do not rub incison.    Presently not taking any the pain medicine. Very comfortable and happy with her present progress.  Review of Systems   Objective: Vital Signs: There were no vitals taken for this visit.  Physical Exam  Ortho Exam right foot exam demonstrates normal healing of the small incision in the dorsal lateral aspect of the foot. Skin intact. Neurovascular exam intact.  Right shoulder wounds healing without problem. Passive range of motion to 90 of abduction and flexion without problem. Neurovascular exam intact. Good grip and release.  Specialty Comments:  No specialty comments available.  Imaging: No results found.   PMFS History: Patient Active Problem List   Diagnosis Date Noted  . Impingement syndrome of right shoulder  10/01/2016  . Nontraumatic incomplete tear of right rotator cuff 10/01/2016  . Os acromiale of right shoulder 10/01/2016  . Osteoarthritis of right AC (acromioclavicular) joint 10/01/2016  . Rotator cuff tear, non-traumatic, right 08/29/2016  . Benign paroxysmal positional vertigo 11/17/2013  . Bronchitis 11/22/2011   Past Medical History:  Diagnosis Date  . Asthma   . Corneal erosion of right eye    chronic abrasion due to dry eye  . Degenerative disc disease, cervical    osteoarthris  . Early cataract right  . Ganglion cyst of right foot   . GERD (gastroesophageal reflux disease)   . Glaucoma   . Headache   . Hyperlipidemia   . Hypertension   . Impingement syndrome of right shoulder    rotator cuff tear  . Insomnia   . Obesity (BMI 35.0-39.9 without comorbidity)   . Peptic ulcer disease   . Pneumonia   . PONV (postoperative nausea and vomiting)   . PVC's (premature ventricular contractions)    benign  . Sleep apnea 2015   wears CPAP    Family History  Problem Relation Age of Onset  . Lymphoma Mother   . Endometrial cancer Mother   . Hypertension Mother   . Heart attack Mother   . COPD Father   . Hypertension Father   . Diabetes Father   . Macular degeneration Father   . Heart failure Father   . Colon polyps Father   .  Dementia Father   . Hyperlipidemia Sister   . Hypertension Sister   . Diabetes Brother   . Diabetes Brother     Past Surgical History:  Procedure Laterality Date  . ABDOMINAL HYSTERECTOMY    . CERVICAL FUSION    . CERVICAL FUSION    . COLONOSCOPY    . EAR CYST EXCISION Right 10/01/2016   Procedure: EXCISION GANGLION CYST RIGHT FOOT LATERAL ;  Surgeon: Valeria BatmanPeter W Whitfield, MD;  Location: [facility];  Service: Orthopedics;  Laterality: Right;  . FOOT NEUROMA SURGERY Left   . FOOT SURGERY Left   . HAND SURGERY    . reduction mammoplasty    . SALIVARY GLAND SURGERY Right 1992  . SHOULDER ARTHROSCOPY WITH OPEN ROTATOR CUFF REPAIR AND DISTAL CLAVICLE  ACROMINECTOMY Right 10/01/2016   Procedure: SHOULDER ARTHROSCOPY WITH SUBACROMIAL DECOMPRESSION, MINI-OPEN ROTATOR CUFF REPAIR AND DISTAL CLAVICLE RESECTION;  Surgeon: Valeria BatmanPeter W Whitfield, MD;  Location: [facility];  Service: Orthopedics;  Laterality: Right;   Social History   Occupational History  . Not on file.   Social History Main Topics  . Smoking status: Never Smoker  . Smokeless tobacco: Never Used  . Alcohol use 1.2 - 1.8 oz/week    2 - 3 Glasses of wine per week     Comment: occasional  . Drug use: No  . Sexual activity: Not on file

## 2016-10-22 ENCOUNTER — Ambulatory Visit: Payer: 59

## 2016-10-22 DIAGNOSIS — M25611 Stiffness of right shoulder, not elsewhere classified: Secondary | ICD-10-CM

## 2016-10-22 DIAGNOSIS — G8929 Other chronic pain: Secondary | ICD-10-CM

## 2016-10-22 DIAGNOSIS — Z9889 Other specified postprocedural states: Secondary | ICD-10-CM | POA: Diagnosis not present

## 2016-10-22 DIAGNOSIS — M6281 Muscle weakness (generalized): Secondary | ICD-10-CM

## 2016-10-22 DIAGNOSIS — M25511 Pain in right shoulder: Secondary | ICD-10-CM

## 2016-10-22 NOTE — Therapy (Addendum)
[facility] [place], [place], [address] Phone: [phone]   Fax:  [phone]  Physical Therapy Treatment/Discharge  Patient Details  Name: Michele Quinn MRN: [id number] Date of Birth: Jun 17, 1956 Referring Provider: Joni Fears, MD  Encounter Date: 10/22/2016      PT End of Session - 10/22/16 1235    Visit Number 3   Number of Visits 24   Date for PT Re-Evaluation 01/10/17   Authorization Type [employer]   PT Start Time 1152   PT Stop Time 1222   PT Time Calculation (min) 30 min   Activity Tolerance Patient tolerated treatment well;No increased pain   Behavior During Therapy WFL for tasks assessed/performed      Past Medical History:  Diagnosis Date  . Asthma   . Corneal erosion of right eye    chronic abrasion due to dry eye  . Degenerative disc disease, cervical    osteoarthris  . Early cataract right  . Ganglion cyst of right foot   . GERD (gastroesophageal reflux disease)   . Glaucoma   . Headache   . Hyperlipidemia   . Hypertension   . Impingement syndrome of right shoulder    rotator cuff tear  . Insomnia   . Obesity (BMI 35.0-39.9 without comorbidity)   . Peptic ulcer disease   . Pneumonia   . PONV (postoperative nausea and vomiting)   . PVC's (premature ventricular contractions)    benign  . Sleep apnea 2015   wears CPAP    Past Surgical History:  Procedure Laterality Date  . ABDOMINAL HYSTERECTOMY    . CERVICAL FUSION    . CERVICAL FUSION    . COLONOSCOPY    . EAR CYST EXCISION Right 10/01/2016   Procedure: EXCISION GANGLION CYST RIGHT FOOT LATERAL ;  Surgeon: Garald Balding, MD;  Location: [facility];  Service: Orthopedics;  Laterality: Right;  . FOOT NEUROMA SURGERY Left   . FOOT SURGERY Left   . HAND SURGERY    . reduction mammoplasty    . SALIVARY GLAND SURGERY Right 1992  . SHOULDER ARTHROSCOPY WITH OPEN ROTATOR CUFF REPAIR AND DISTAL CLAVICLE ACROMINECTOMY Right 10/01/2016    Procedure: SHOULDER ARTHROSCOPY WITH SUBACROMIAL DECOMPRESSION, MINI-OPEN ROTATOR CUFF REPAIR AND DISTAL CLAVICLE RESECTION;  Surgeon: Garald Balding, MD;  Location: [facility];  Service: Orthopedics;  Laterality: Right;    There were no vitals filed for this visit.      Subjective Assessment - 10/22/16 1152    Subjective No pain at rest today   Currently in Pain? No/denies                         [facility] Adult PT Treatment/Exercise - 10/22/16 0001      Shoulder Exercises: Supine   Other Supine Exercises she verbalized and brief demo of cane exercises.      Shoulder Exercises: Pulleys   Flexion Limitations 25 reps     Manual Therapy   Passive ROM All planes with in 3 week RTC protocol                 PT Education - 10/21/16 1152    Education provided Yes   Education Details HEP,  precautions.  [facility] only.   Person(s) Educated Patient   Methods Explanation;Demonstration;Tactile cues;Verbal cues;Handout   Comprehension Verbalized understanding;Returned demonstration          PT Short Term Goals - 10/22/16 1239  PT SHORT TERM GOAL #1   Title she will be independent with inital HEP   Status Achieved     PT SHORT TERM GOAL #2   Title She will be able to do activities limited by protocol  at week 6   Status On-going           PT Long Term Goals - 10/17/16 1445      PT LONG TERM GOAL #1   Title She will be independnet with all HEP issued   Time 12   Period Weeks   Status New     PT LONG TERM GOAL #2   Title She will report minimal pain with normal self care and be independent with this   Time 12   Period Weeks   Status New     PT LONG TERM GOAL #3   Title She will return to home tasks as allowed by protocol at week 12   Time 12   Period Weeks   Status New     PT LONG TERM GOAL #4   Title She will be able to put items into upper canbinets  with min to no pain   Time 12   Period Weeks   Status New     PT LONG TERM GOAL #5    Title she will return to driving  with RT arm   Time 12   Period Weeks   Status New     Additional Long Term Goals   Additional Long Term Goals --     PT LONG TERM GOAL #6   Title she will return to some light to moderate outdoor activity with min to no pain.    Time 12   Period Weeks   Status New     PT LONG TERM GOAL #7   Title Active ROM equal to LT for independence with normal use of RT arm   Time 12   Period Weeks   Status New               Plan - 10/22/16 1236    Clinical Impression Statement No pain at rest mild to sharper pain at end range . This does not linger as we stop as soon as pain starts.  ROM passive is much improved from eval . She is progressing nicely and  we will see her at end of next week , measure and advance from there.   PT Next Visit Plan Active asssit /PROM per MD order then light resistance    PT Home Exercise Plan Cane chest press, flexion,  horizontal abd.   Consulted and Agree with Plan of Care Patient      Patient will benefit from skilled therapeutic intervention in order to improve the following deficits and impairments:     Visit Diagnosis: Status post right rotator cuff repair  Chronic right shoulder pain  Stiffness of right shoulder, not elsewhere classified  Muscle weakness (generalized)     Problem List Patient Active Problem List   Diagnosis Date Noted  . Impingement syndrome of right shoulder 10/01/2016  . Nontraumatic incomplete tear of right rotator cuff 10/01/2016  . Os acromiale of right shoulder 10/01/2016  . Osteoarthritis of right AC (acromioclavicular) joint 10/01/2016  . Rotator cuff tear, non-traumatic, right 08/29/2016  . Benign paroxysmal positional vertigo 11/17/2013  . Bronchitis 11/22/2011    Darrel Hoover  PT 10/22/2016, 12:42 PM  [place], [place],  [address] Phone: [phone]   Fax:  [phone]  Name:  JONNI OELKERS MRN: [id number] Date of Birth: 06/25/1956  PHYSICAL THERAPY DISCHARGE SUMMARY  Visits from Start of Care: 3  Current functional level related to goals / functional outcomes: Unknown. She canceled appointments after this one and was at the beach.    Remaining deficits: Unknown   Education / Equipment: HEP Plan:                                                    Patient goals were not met. Patient is being discharged due to not returning since the last visit.  ?????  Lillette Boxer Abdurahman Rugg  PT  12/03/16  12:23 PM

## 2016-10-24 DIAGNOSIS — G4733 Obstructive sleep apnea (adult) (pediatric): Secondary | ICD-10-CM | POA: Diagnosis not present

## 2016-10-29 ENCOUNTER — Telehealth (INDEPENDENT_AMBULATORY_CARE_PROVIDER_SITE_OTHER): Payer: Self-pay

## 2016-10-29 NOTE — Telephone Encounter (Signed)
Spoke with pt and I had set up her outpatient therapy. And appt is set with them

## 2016-10-29 NOTE — Telephone Encounter (Signed)
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

## 2016-10-30 ENCOUNTER — Encounter: Payer: Commercial Managed Care - HMO | Admitting: Physical Therapy

## 2016-11-01 ENCOUNTER — Ambulatory Visit: Payer: 59

## 2016-11-05 ENCOUNTER — Ambulatory Visit: Payer: 59 | Admitting: Physical Therapy

## 2016-11-07 ENCOUNTER — Ambulatory Visit: Payer: 59 | Admitting: Physical Therapy

## 2016-11-08 ENCOUNTER — Ambulatory Visit (INDEPENDENT_AMBULATORY_CARE_PROVIDER_SITE_OTHER): Payer: Commercial Managed Care - HMO | Admitting: Orthopaedic Surgery

## 2016-11-12 ENCOUNTER — Ambulatory Visit: Payer: 59

## 2016-11-13 ENCOUNTER — Ambulatory Visit (INDEPENDENT_AMBULATORY_CARE_PROVIDER_SITE_OTHER): Payer: Commercial Managed Care - HMO | Admitting: Orthopedic Surgery

## 2016-11-14 ENCOUNTER — Ambulatory Visit: Payer: 59

## 2016-11-20 ENCOUNTER — Ambulatory Visit (INDEPENDENT_AMBULATORY_CARE_PROVIDER_SITE_OTHER): Payer: 59 | Admitting: Orthopedic Surgery

## 2016-11-20 ENCOUNTER — Encounter (INDEPENDENT_AMBULATORY_CARE_PROVIDER_SITE_OTHER): Payer: Self-pay | Admitting: Orthopedic Surgery

## 2016-11-20 VITALS — BP 128/71 | HR 77 | Resp 14 | Ht 64.5 in | Wt 208.0 lb

## 2016-11-20 DIAGNOSIS — Z9889 Other specified postprocedural states: Secondary | ICD-10-CM

## 2016-11-20 NOTE — Progress Notes (Signed)
Office Visit Note   Patient: Michele Quinn           Date of Birth: 1955/10/31           MRN: [id number] Visit Date: 11/20/2016              Requested by: Kirby Funk, MD [address] [place], [place] [address] PCP: Lillia Mountain, MD   Assessment & Plan: Visit Diagnoses:  1. S/P right rotator cuff repair   2. History of excision of mass     Plan:  #1: Continue her exercise program. #2: Still limit her activity over the next several weeks as we discussed  Follow-Up Instructions: Return in about 6 weeks (around 01/01/2017).   Orders:  No orders of the defined types were placed in this encounter.  No orders of the defined types were placed in this encounter.     Procedures: No procedures performed   Clinical Data: No additional findings.   Subjective: Chief Complaint  Patient presents with  . Right Shoulder - Routine Post Op  . Right Foot - Routine Post Op    Michele Quinn is seen today for follow-up of her Right shoulder surgery and right foot mass excision post op 10/01/16. She is doing great.  Good range of motion and no pain. She has been doing her own therapy on YouTube. She is exceedingly happy with the results.      Review of Systems   Objective: Vital Signs: BP 128/71 (BP Location: Left Arm, Patient Position: Sitting, Cuff Size: Normal)   Pulse 77   Resp 14   Ht 5' 4.5" (1.638 m)   Wt 208 lb (94.3 kg)   BMI 35.15 kg/m   Physical Exam  Right Ankle Exam   Tenderness  The patient is experiencing no tenderness.  Comments:  Wound is well healed along the right lateral aspect of the foot where the mass was excised. Her incision is well-healed. She is neurovascular intact distally.   Right Shoulder Exam   Tenderness  The patient is experiencing no tenderness.    Range of Motion  Active Abduction: normal  Passive Abduction: normal  Extension: normal  Forward Flexion: normal  External Rotation: normal  Internal  Rotation 90 degrees: 70   Muscle Strength  Abduction: 4/5  Internal Rotation: 4/5  External Rotation: 4/5  Supraspinatus: 4/5  Subscapularis: 4/5  Biceps: 4/5   Other  Sensation: normal  Comments:  Wounds are clean and dry and well-healed.      Specialty Comments:  No specialty comments available.  Imaging: No results found.   PMFS History: Patient Active Problem List   Diagnosis Date Noted  . Impingement syndrome of right shoulder 10/01/2016  . Nontraumatic incomplete tear of right rotator cuff 10/01/2016  . Os acromiale of right shoulder 10/01/2016  . Osteoarthritis of right AC (acromioclavicular) joint 10/01/2016  . Rotator cuff tear, non-traumatic, right 08/29/2016  . Benign paroxysmal positional vertigo 11/17/2013  . Bronchitis 11/22/2011   Past Medical History:  Diagnosis Date  . Asthma   . Corneal erosion of right eye    chronic abrasion due to dry eye  . Degenerative disc disease, cervical    osteoarthris  . Early cataract right  . Ganglion cyst of right foot   . GERD (gastroesophageal reflux disease)   . Glaucoma   . Headache   . Hyperlipidemia   . Hypertension   . Impingement syndrome of right shoulder    rotator cuff tear  .  Insomnia   . Obesity (BMI 35.0-39.9 without comorbidity)   . Peptic ulcer disease   . Pneumonia   . PONV (postoperative nausea and vomiting)   . PVC's (premature ventricular contractions)    benign  . Sleep apnea 2015   wears CPAP    Family History  Problem Relation Age of Onset  . Lymphoma Mother   . Endometrial cancer Mother   . Hypertension Mother   . Heart attack Mother   . COPD Father   . Hypertension Father   . Diabetes Father   . Macular degeneration Father   . Heart failure Father   . Colon polyps Father   . Dementia Father   . Hyperlipidemia Sister   . Hypertension Sister   . Diabetes Brother   . Diabetes Brother     Past Surgical History:  Procedure Laterality Date  . ABDOMINAL HYSTERECTOMY      . CERVICAL FUSION    . CERVICAL FUSION    . COLONOSCOPY    . EAR CYST EXCISION Right 10/01/2016   Procedure: EXCISION GANGLION CYST RIGHT FOOT LATERAL ;  Surgeon: Valeria BatmanPeter W Whitfield, MD;  Location: [facility];  Service: Orthopedics;  Laterality: Right;  . FOOT NEUROMA SURGERY Left   . FOOT SURGERY Left   . HAND SURGERY    . reduction mammoplasty    . SALIVARY GLAND SURGERY Right 1992  . SHOULDER ARTHROSCOPY WITH OPEN ROTATOR CUFF REPAIR AND DISTAL CLAVICLE ACROMINECTOMY Right 10/01/2016   Procedure: SHOULDER ARTHROSCOPY WITH SUBACROMIAL DECOMPRESSION, MINI-OPEN ROTATOR CUFF REPAIR AND DISTAL CLAVICLE RESECTION;  Surgeon: Valeria BatmanPeter W Whitfield, MD;  Location: [facility];  Service: Orthopedics;  Laterality: Right;   Social History   Occupational History  . Not on file.   Social History Main Topics  . Smoking status: Never Smoker  . Smokeless tobacco: Never Used  . Alcohol use 1.2 - 1.8 oz/week    2 - 3 Glasses of wine per week     Comment: occasional  . Drug use: No  . Sexual activity: Not on file

## 2017-01-01 ENCOUNTER — Ambulatory Visit (INDEPENDENT_AMBULATORY_CARE_PROVIDER_SITE_OTHER): Payer: 59 | Admitting: Orthopedic Surgery

## 2017-01-27 DIAGNOSIS — G4733 Obstructive sleep apnea (adult) (pediatric): Secondary | ICD-10-CM | POA: Diagnosis not present

## 2017-02-21 NOTE — Addendum Note (Signed)
Addendum  created 02/21/17 0854 by Herson Prichard, MD   Sign clinical note    

## 2017-02-21 NOTE — Addendum Note (Signed)
Addendum  created 02/21/17 0906 by Sharee HolsterMassagee, Geofrey Silliman, MD   Sign clinical note

## 2017-03-11 DIAGNOSIS — H40013 Open angle with borderline findings, low risk, bilateral: Secondary | ICD-10-CM | POA: Diagnosis not present

## 2017-03-11 DIAGNOSIS — H2513 Age-related nuclear cataract, bilateral: Secondary | ICD-10-CM | POA: Diagnosis not present

## 2017-03-11 DIAGNOSIS — H04123 Dry eye syndrome of bilateral lacrimal glands: Secondary | ICD-10-CM | POA: Diagnosis not present

## 2017-03-12 ENCOUNTER — Other Ambulatory Visit: Payer: Self-pay | Admitting: Internal Medicine

## 2017-03-12 DIAGNOSIS — Z1231 Encounter for screening mammogram for malignant neoplasm of breast: Secondary | ICD-10-CM

## 2017-04-09 ENCOUNTER — Ambulatory Visit: Payer: Commercial Managed Care - HMO

## 2017-04-10 ENCOUNTER — Ambulatory Visit: Payer: Commercial Managed Care - HMO

## 2017-07-25 DIAGNOSIS — G4733 Obstructive sleep apnea (adult) (pediatric): Secondary | ICD-10-CM | POA: Diagnosis not present

## 2017-08-12 ENCOUNTER — Ambulatory Visit
Admission: RE | Admit: 2017-08-12 | Discharge: 2017-08-12 | Disposition: A | Discharge status: Home / Self Care | Payer: Commercial Managed Care - HMO | Source: Ambulatory Visit | Attending: Internal Medicine | Admitting: Internal Medicine

## 2017-08-12 DIAGNOSIS — Z1231 Encounter for screening mammogram for malignant neoplasm of breast: Secondary | ICD-10-CM

## 2017-09-04 DIAGNOSIS — K219 Gastro-esophageal reflux disease without esophagitis: Secondary | ICD-10-CM | POA: Diagnosis not present

## 2017-09-04 DIAGNOSIS — Z23 Encounter for immunization: Secondary | ICD-10-CM | POA: Diagnosis not present

## 2017-09-04 DIAGNOSIS — I10 Essential (primary) hypertension: Secondary | ICD-10-CM | POA: Diagnosis not present

## 2017-09-04 DIAGNOSIS — E7800 Pure hypercholesterolemia, unspecified: Secondary | ICD-10-CM | POA: Diagnosis not present

## 2017-09-08 DIAGNOSIS — H40013 Open angle with borderline findings, low risk, bilateral: Secondary | ICD-10-CM | POA: Diagnosis not present

## 2017-09-08 DIAGNOSIS — H18831 Recurrent erosion of cornea, right eye: Secondary | ICD-10-CM | POA: Diagnosis not present

## 2017-11-05 DIAGNOSIS — G4733 Obstructive sleep apnea (adult) (pediatric): Secondary | ICD-10-CM | POA: Diagnosis not present

## 2018-01-26 DIAGNOSIS — G4733 Obstructive sleep apnea (adult) (pediatric): Secondary | ICD-10-CM | POA: Diagnosis not present

## 2018-04-10 DIAGNOSIS — R079 Chest pain, unspecified: Secondary | ICD-10-CM | POA: Diagnosis not present

## 2018-04-10 DIAGNOSIS — K219 Gastro-esophageal reflux disease without esophagitis: Secondary | ICD-10-CM | POA: Diagnosis not present

## 2018-05-13 DIAGNOSIS — G4733 Obstructive sleep apnea (adult) (pediatric): Secondary | ICD-10-CM | POA: Diagnosis not present

## 2018-08-18 DIAGNOSIS — G4733 Obstructive sleep apnea (adult) (pediatric): Secondary | ICD-10-CM | POA: Diagnosis not present

## 2018-08-18 DIAGNOSIS — H40023 Open angle with borderline findings, high risk, bilateral: Secondary | ICD-10-CM | POA: Diagnosis not present

## 2018-08-18 DIAGNOSIS — H5212 Myopia, left eye: Secondary | ICD-10-CM | POA: Diagnosis not present

## 2018-08-18 DIAGNOSIS — H43813 Vitreous degeneration, bilateral: Secondary | ICD-10-CM | POA: Diagnosis not present

## 2018-08-18 DIAGNOSIS — H2513 Age-related nuclear cataract, bilateral: Secondary | ICD-10-CM | POA: Diagnosis not present

## 2018-09-30 DIAGNOSIS — H40023 Open angle with borderline findings, high risk, bilateral: Secondary | ICD-10-CM | POA: Diagnosis not present

## 2018-10-01 DIAGNOSIS — G8929 Other chronic pain: Secondary | ICD-10-CM | POA: Diagnosis not present

## 2018-10-01 DIAGNOSIS — M461 Sacroiliitis, not elsewhere classified: Secondary | ICD-10-CM | POA: Diagnosis not present

## 2018-10-01 DIAGNOSIS — M47816 Spondylosis without myelopathy or radiculopathy, lumbar region: Secondary | ICD-10-CM | POA: Diagnosis not present

## 2018-10-01 DIAGNOSIS — R1031 Right lower quadrant pain: Secondary | ICD-10-CM | POA: Diagnosis not present

## 2018-10-02 DIAGNOSIS — Z0000 Encounter for general adult medical examination without abnormal findings: Secondary | ICD-10-CM | POA: Diagnosis not present

## 2018-10-02 DIAGNOSIS — I10 Essential (primary) hypertension: Secondary | ICD-10-CM | POA: Diagnosis not present

## 2018-10-02 DIAGNOSIS — G4733 Obstructive sleep apnea (adult) (pediatric): Secondary | ICD-10-CM | POA: Diagnosis not present

## 2018-10-02 DIAGNOSIS — K219 Gastro-esophageal reflux disease without esophagitis: Secondary | ICD-10-CM | POA: Diagnosis not present

## 2018-10-02 DIAGNOSIS — E7800 Pure hypercholesterolemia, unspecified: Secondary | ICD-10-CM | POA: Diagnosis not present

## 2018-10-02 DIAGNOSIS — E781 Pure hyperglyceridemia: Secondary | ICD-10-CM | POA: Diagnosis not present

## 2018-10-07 DIAGNOSIS — Z6836 Body mass index (BMI) 36.0-36.9, adult: Secondary | ICD-10-CM | POA: Diagnosis not present

## 2018-10-07 DIAGNOSIS — I10 Essential (primary) hypertension: Secondary | ICD-10-CM | POA: Diagnosis not present

## 2018-10-07 DIAGNOSIS — M461 Sacroiliitis, not elsewhere classified: Secondary | ICD-10-CM | POA: Diagnosis not present

## 2018-11-18 DIAGNOSIS — Z01818 Encounter for other preprocedural examination: Secondary | ICD-10-CM | POA: Diagnosis not present

## 2018-11-26 ENCOUNTER — Other Ambulatory Visit: Payer: Self-pay | Admitting: Internal Medicine

## 2018-11-26 DIAGNOSIS — Z1231 Encounter for screening mammogram for malignant neoplasm of breast: Secondary | ICD-10-CM

## 2018-12-23 ENCOUNTER — Ambulatory Visit: Payer: Commercial Managed Care - HMO

## 2019-02-08 ENCOUNTER — Ambulatory Visit
Admission: RE | Admit: 2019-02-08 | Discharge: 2019-02-08 | Disposition: A | Discharge status: Home / Self Care | Payer: BLUE CROSS/BLUE SHIELD | Source: Ambulatory Visit | Attending: Internal Medicine | Admitting: Internal Medicine

## 2019-02-08 ENCOUNTER — Other Ambulatory Visit: Payer: Self-pay

## 2019-02-08 DIAGNOSIS — Z1231 Encounter for screening mammogram for malignant neoplasm of breast: Secondary | ICD-10-CM

## 2019-02-19 ENCOUNTER — Other Ambulatory Visit: Payer: Self-pay | Admitting: Family Medicine

## 2019-02-19 ENCOUNTER — Other Ambulatory Visit (HOSPITAL_BASED_OUTPATIENT_CLINIC_OR_DEPARTMENT_OTHER): Payer: Self-pay | Admitting: Internal Medicine

## 2019-02-19 ENCOUNTER — Other Ambulatory Visit: Payer: Self-pay | Admitting: Internal Medicine

## 2019-02-19 DIAGNOSIS — R1011 Right upper quadrant pain: Secondary | ICD-10-CM

## 2019-02-22 ENCOUNTER — Ambulatory Visit (HOSPITAL_BASED_OUTPATIENT_CLINIC_OR_DEPARTMENT_OTHER)
Admission: RE | Admit: 2019-02-22 | Discharge: 2019-02-22 | Disposition: A | Discharge status: Home / Self Care | Payer: BLUE CROSS/BLUE SHIELD | Source: Ambulatory Visit | Attending: Internal Medicine | Admitting: Internal Medicine

## 2019-02-22 ENCOUNTER — Other Ambulatory Visit: Payer: Self-pay

## 2019-02-22 DIAGNOSIS — R1011 Right upper quadrant pain: Secondary | ICD-10-CM | POA: Diagnosis not present

## 2019-02-22 DIAGNOSIS — R112 Nausea with vomiting, unspecified: Secondary | ICD-10-CM | POA: Diagnosis not present

## 2019-03-03 ENCOUNTER — Other Ambulatory Visit: Payer: BLUE CROSS/BLUE SHIELD

## 2019-04-20 DIAGNOSIS — M461 Sacroiliitis, not elsewhere classified: Secondary | ICD-10-CM | POA: Diagnosis not present

## 2019-05-05 DIAGNOSIS — H401131 Primary open-angle glaucoma, bilateral, mild stage: Secondary | ICD-10-CM | POA: Diagnosis not present

## 2019-05-05 DIAGNOSIS — H18831 Recurrent erosion of cornea, right eye: Secondary | ICD-10-CM | POA: Diagnosis not present

## 2019-05-06 DIAGNOSIS — G4733 Obstructive sleep apnea (adult) (pediatric): Secondary | ICD-10-CM | POA: Diagnosis not present

## 2019-05-21 DIAGNOSIS — Z23 Encounter for immunization: Secondary | ICD-10-CM | POA: Diagnosis not present

## 2019-06-02 DIAGNOSIS — I10 Essential (primary) hypertension: Secondary | ICD-10-CM | POA: Diagnosis not present

## 2019-06-02 DIAGNOSIS — M13172 Monoarthritis, not elsewhere classified, left ankle and foot: Secondary | ICD-10-CM | POA: Diagnosis not present

## 2019-07-26 DIAGNOSIS — M109 Gout, unspecified: Secondary | ICD-10-CM | POA: Diagnosis not present

## 2019-08-02 DIAGNOSIS — Z1159 Encounter for screening for other viral diseases: Secondary | ICD-10-CM | POA: Diagnosis not present

## 2019-08-05 DIAGNOSIS — D122 Benign neoplasm of ascending colon: Secondary | ICD-10-CM | POA: Diagnosis not present

## 2019-08-05 DIAGNOSIS — Z1211 Encounter for screening for malignant neoplasm of colon: Secondary | ICD-10-CM | POA: Diagnosis not present

## 2019-10-22 ENCOUNTER — Ambulatory Visit: Payer: Medicare Other

## 2019-12-13 ENCOUNTER — Other Ambulatory Visit: Payer: Self-pay | Admitting: Internal Medicine

## 2019-12-13 DIAGNOSIS — E2839 Other primary ovarian failure: Secondary | ICD-10-CM

## 2020-01-24 ENCOUNTER — Other Ambulatory Visit: Payer: Self-pay | Admitting: Internal Medicine

## 2020-01-24 DIAGNOSIS — Z1231 Encounter for screening mammogram for malignant neoplasm of breast: Secondary | ICD-10-CM

## 2020-04-11 ENCOUNTER — Ambulatory Visit
Admission: RE | Admit: 2020-04-11 | Discharge: 2020-04-11 | Disposition: A | Discharge status: Home / Self Care | Payer: Medicare Other | Source: Ambulatory Visit | Attending: Internal Medicine | Admitting: Internal Medicine

## 2020-04-11 ENCOUNTER — Other Ambulatory Visit: Payer: Self-pay

## 2020-04-11 DIAGNOSIS — Z1231 Encounter for screening mammogram for malignant neoplasm of breast: Secondary | ICD-10-CM

## 2020-04-11 DIAGNOSIS — E2839 Other primary ovarian failure: Secondary | ICD-10-CM

## 2020-04-27 IMAGING — US ULTRASOUND ABDOMEN LIMITED
1 series · 14 of 25 positions shown · non-contrast
Comparison: None

CLINICAL DATA: RIGHT upper quadrant pain intermittently for 6
weeks, single episode of nausea and vomiting 6 weeks ago

EXAM:
ULTRASOUND ABDOMEN LIMITED RIGHT UPPER QUADRANT

[Series 1: ultrasound abdomen limited · 14 of 73 slices shown]
[im 1/73]
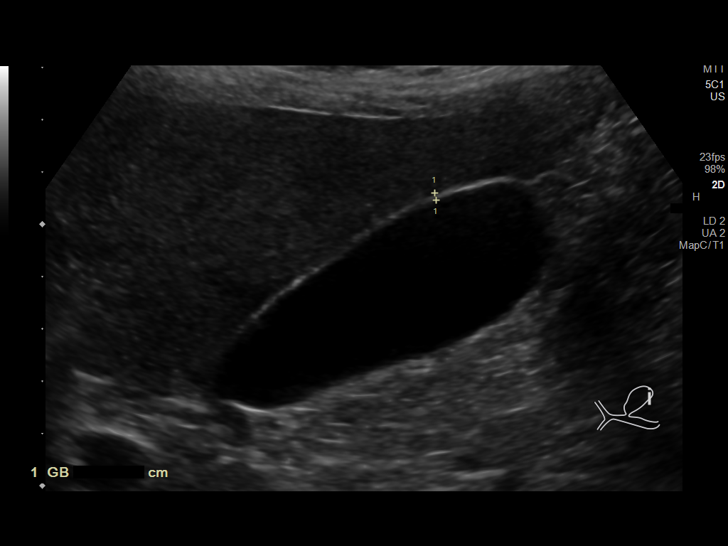
[im 7/73]
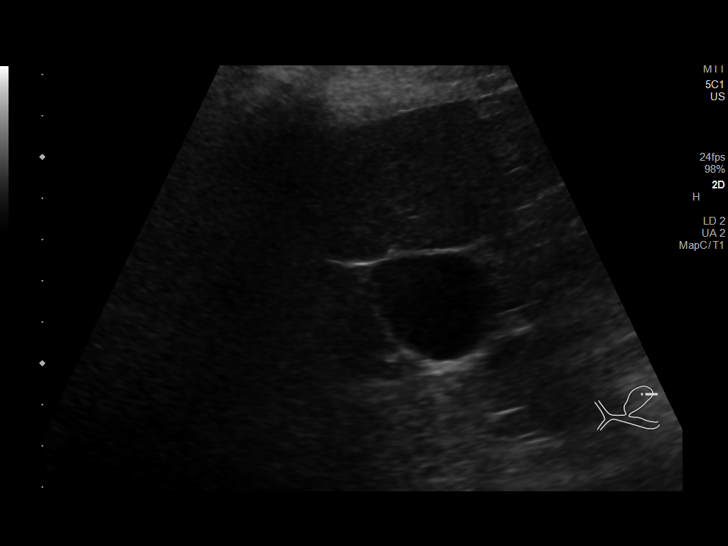
[im 13/73]
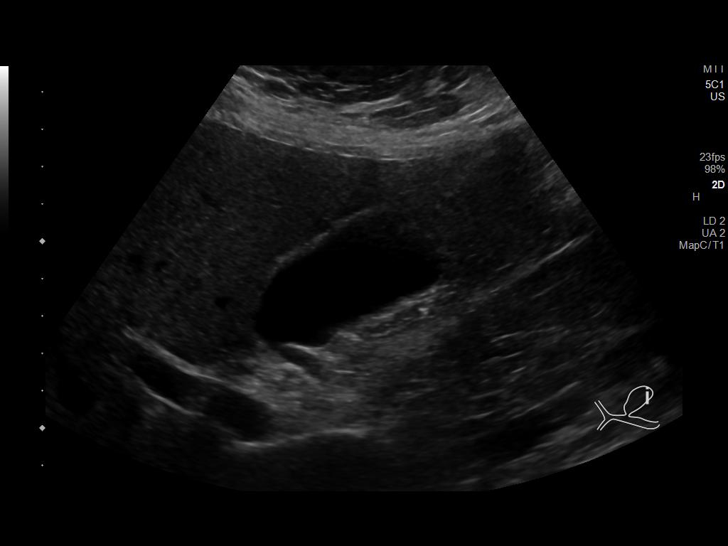
[im 19/73]
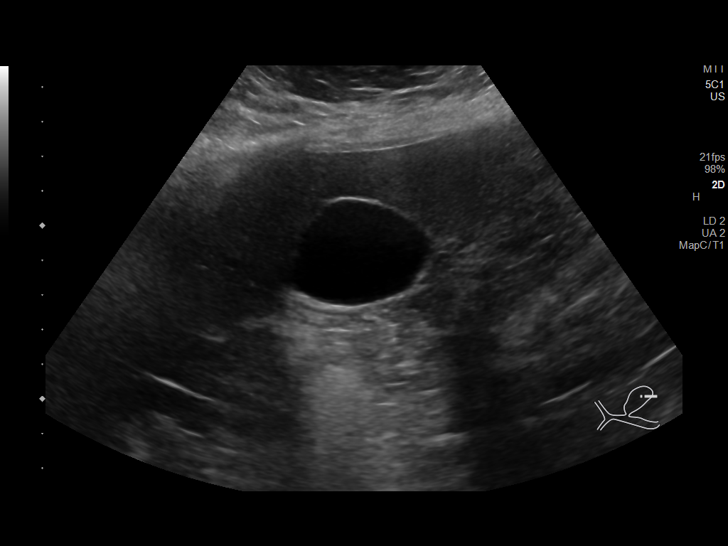
[im 25/73]
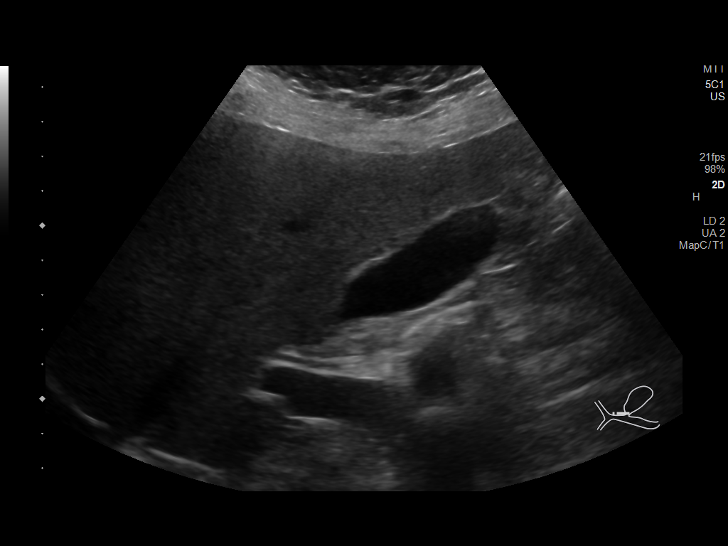
[im 28/73]
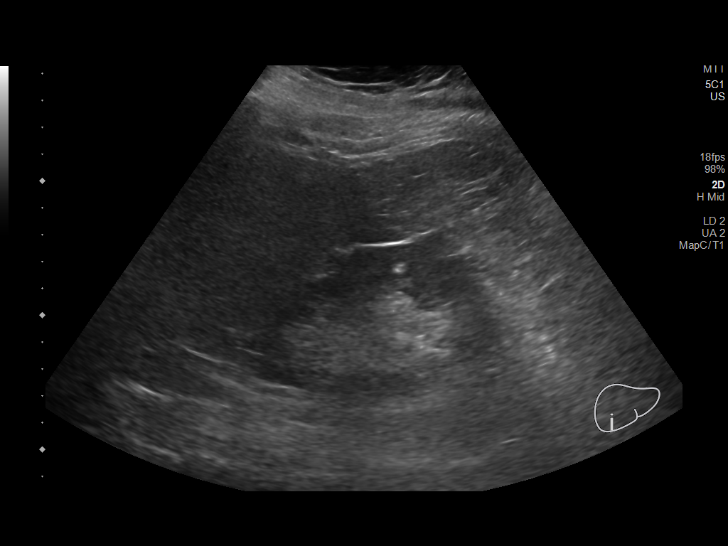
[im 34/73]
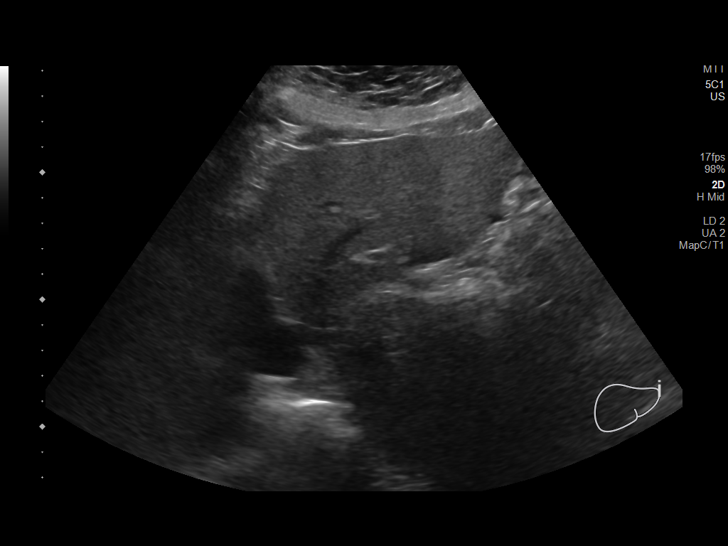
[im 40/73]
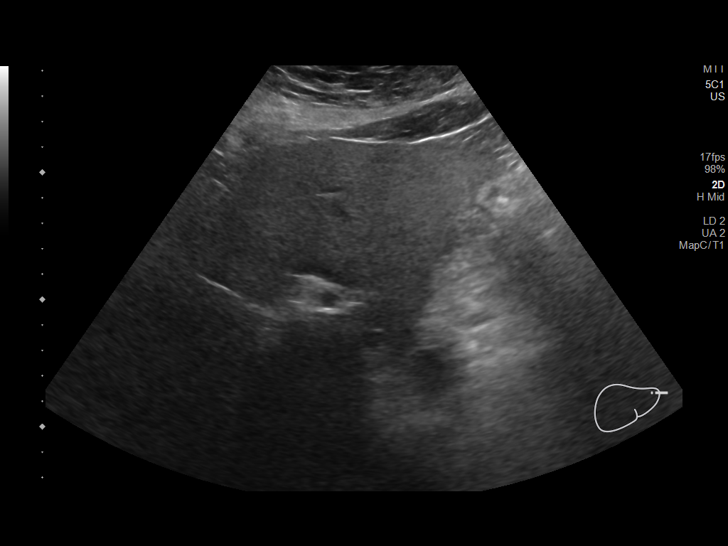
[im 46/73]
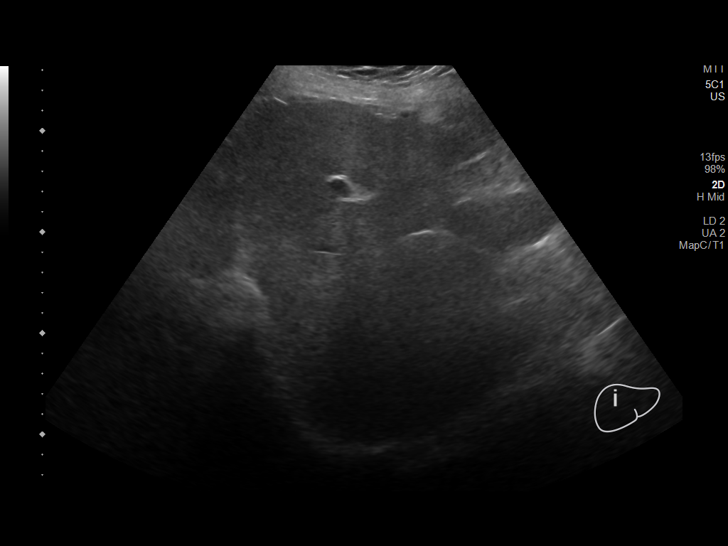
[im 49/73]
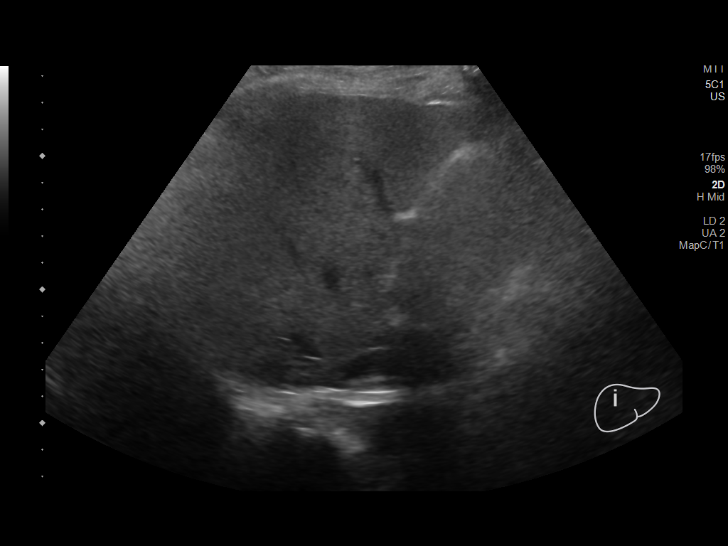
[im 55/73]
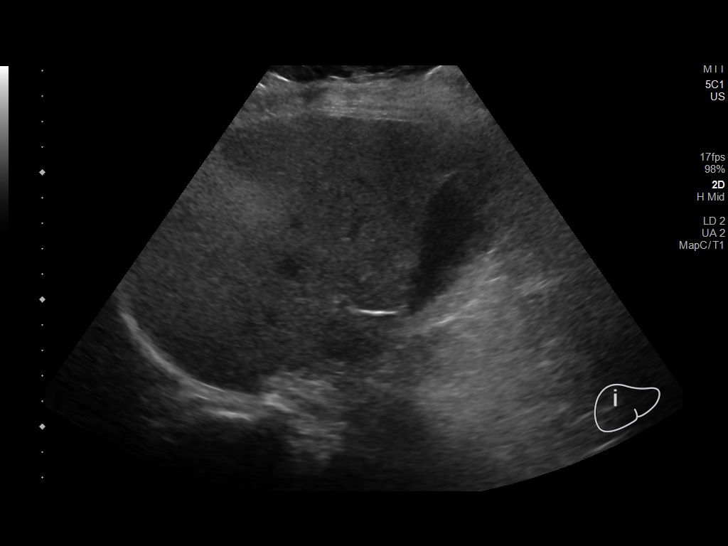
[im 61/73]
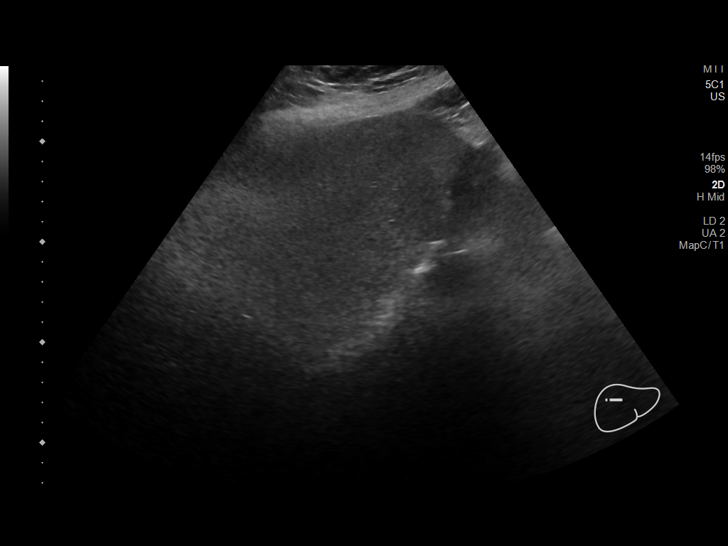
[im 67/73]
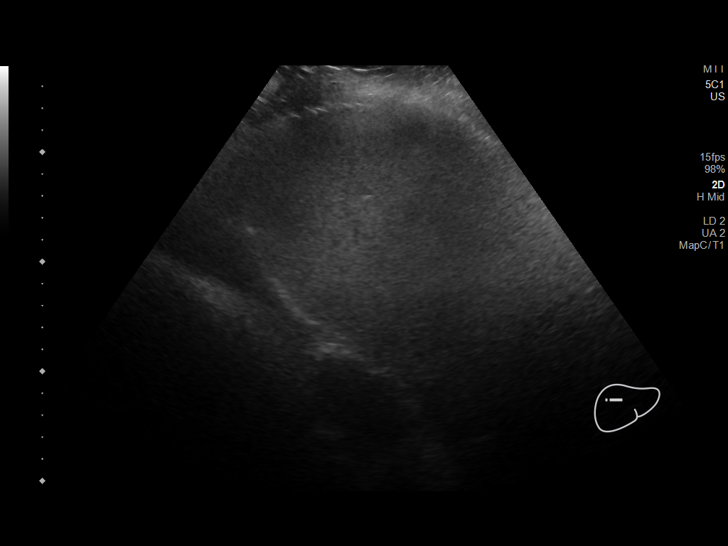
[im 73/73]
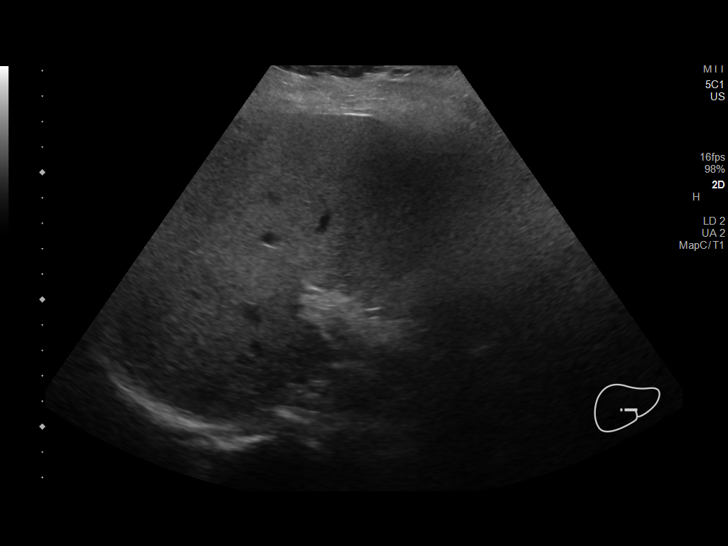

[14 of 25 positions shown; findings below may reference images not displayed]

FINDINGS: Gallbladder:

Normally distended without stones or wall thickening. No
pericholecystic fluid or sonographic Murphy sign.

Common bile duct:

Diameter: Normal caliber 3 mm diameter

Liver:

Echogenic parenchyma, likely fatty infiltration though this can be
seen with cirrhosis and certain infiltrative disorders. No focal
hepatic mass or nodularity identified, though assessment of
intrahepatic detail is suboptimal due to body habitus and sound
attenuation. Portal vein is patent on color Doppler imaging with
normal direction of blood flow towards the liver.

No RIGHT upper quadrant free fluid.
IMPRESSION: Probable fatty infiltration of liver as above.

No definite acute RIGHT upper quadrant sonographic abnormalities
identified.

## 2020-09-28 ENCOUNTER — Other Ambulatory Visit: Payer: Medicare Other

## 2020-09-28 DIAGNOSIS — U071 COVID-19: Secondary | ICD-10-CM | POA: Diagnosis not present

## 2020-09-28 DIAGNOSIS — Z20822 Contact with and (suspected) exposure to covid-19: Secondary | ICD-10-CM | POA: Diagnosis not present

## 2020-09-30 LAB — NOVEL CORONAVIRUS, NAA: SARS-CoV-2, NAA: DETECTED — AB

## 2020-09-30 LAB — SARS-COV-2, NAA 2 DAY TAT

## 2020-11-15 DIAGNOSIS — H524 Presbyopia: Secondary | ICD-10-CM | POA: Diagnosis not present

## 2020-11-15 DIAGNOSIS — H401131 Primary open-angle glaucoma, bilateral, mild stage: Secondary | ICD-10-CM | POA: Diagnosis not present

## 2020-11-15 DIAGNOSIS — H43813 Vitreous degeneration, bilateral: Secondary | ICD-10-CM | POA: Diagnosis not present

## 2020-11-15 DIAGNOSIS — H2513 Age-related nuclear cataract, bilateral: Secondary | ICD-10-CM | POA: Diagnosis not present

## 2020-12-01 DIAGNOSIS — L218 Other seborrheic dermatitis: Secondary | ICD-10-CM | POA: Diagnosis not present

## 2020-12-01 DIAGNOSIS — R21 Rash and other nonspecific skin eruption: Secondary | ICD-10-CM | POA: Diagnosis not present

## 2020-12-01 DIAGNOSIS — L258 Unspecified contact dermatitis due to other agents: Secondary | ICD-10-CM | POA: Diagnosis not present

## 2020-12-08 DIAGNOSIS — J4520 Mild intermittent asthma, uncomplicated: Secondary | ICD-10-CM | POA: Diagnosis not present

## 2020-12-08 DIAGNOSIS — G4700 Insomnia, unspecified: Secondary | ICD-10-CM | POA: Diagnosis not present

## 2020-12-08 DIAGNOSIS — E7800 Pure hypercholesterolemia, unspecified: Secondary | ICD-10-CM | POA: Diagnosis not present

## 2020-12-08 DIAGNOSIS — I10 Essential (primary) hypertension: Secondary | ICD-10-CM | POA: Diagnosis not present

## 2020-12-08 DIAGNOSIS — M169 Osteoarthritis of hip, unspecified: Secondary | ICD-10-CM | POA: Diagnosis not present

## 2020-12-08 DIAGNOSIS — K219 Gastro-esophageal reflux disease without esophagitis: Secondary | ICD-10-CM | POA: Diagnosis not present

## 2020-12-15 ENCOUNTER — Other Ambulatory Visit: Payer: Self-pay | Admitting: Internal Medicine

## 2020-12-15 DIAGNOSIS — R739 Hyperglycemia, unspecified: Secondary | ICD-10-CM | POA: Diagnosis not present

## 2020-12-15 DIAGNOSIS — E7800 Pure hypercholesterolemia, unspecified: Secondary | ICD-10-CM

## 2020-12-15 DIAGNOSIS — G4733 Obstructive sleep apnea (adult) (pediatric): Secondary | ICD-10-CM | POA: Diagnosis not present

## 2020-12-15 DIAGNOSIS — I10 Essential (primary) hypertension: Secondary | ICD-10-CM | POA: Diagnosis not present

## 2020-12-15 DIAGNOSIS — M109 Gout, unspecified: Secondary | ICD-10-CM | POA: Diagnosis not present

## 2020-12-15 DIAGNOSIS — Z0000 Encounter for general adult medical examination without abnormal findings: Secondary | ICD-10-CM | POA: Diagnosis not present

## 2020-12-15 DIAGNOSIS — K219 Gastro-esophageal reflux disease without esophagitis: Secondary | ICD-10-CM | POA: Diagnosis not present

## 2021-01-05 ENCOUNTER — Other Ambulatory Visit: Payer: Medicare Other

## 2021-01-18 ENCOUNTER — Ambulatory Visit
Admission: RE | Admit: 2021-01-18 | Discharge: 2021-01-18 | Disposition: A | Discharge status: Home / Self Care | Payer: No Typology Code available for payment source | Source: Ambulatory Visit | Attending: Internal Medicine | Admitting: Internal Medicine

## 2021-01-18 DIAGNOSIS — E7800 Pure hypercholesterolemia, unspecified: Secondary | ICD-10-CM

## 2021-01-18 DIAGNOSIS — E785 Hyperlipidemia, unspecified: Secondary | ICD-10-CM | POA: Diagnosis not present

## 2021-03-16 DIAGNOSIS — E7800 Pure hypercholesterolemia, unspecified: Secondary | ICD-10-CM | POA: Diagnosis not present

## 2021-03-16 DIAGNOSIS — J4520 Mild intermittent asthma, uncomplicated: Secondary | ICD-10-CM | POA: Diagnosis not present

## 2021-03-16 DIAGNOSIS — G4700 Insomnia, unspecified: Secondary | ICD-10-CM | POA: Diagnosis not present

## 2021-03-16 DIAGNOSIS — I10 Essential (primary) hypertension: Secondary | ICD-10-CM | POA: Diagnosis not present

## 2021-03-16 DIAGNOSIS — K219 Gastro-esophageal reflux disease without esophagitis: Secondary | ICD-10-CM | POA: Diagnosis not present

## 2021-04-19 DIAGNOSIS — W19XXXA Unspecified fall, initial encounter: Secondary | ICD-10-CM | POA: Diagnosis not present

## 2021-04-19 DIAGNOSIS — M25552 Pain in left hip: Secondary | ICD-10-CM | POA: Diagnosis not present

## 2021-04-19 DIAGNOSIS — I10 Essential (primary) hypertension: Secondary | ICD-10-CM | POA: Diagnosis not present

## 2021-04-19 DIAGNOSIS — S76312A Strain of muscle, fascia and tendon of the posterior muscle group at thigh level, left thigh, initial encounter: Secondary | ICD-10-CM | POA: Diagnosis not present

## 2021-05-02 DIAGNOSIS — M461 Sacroiliitis, not elsewhere classified: Secondary | ICD-10-CM | POA: Diagnosis not present

## 2021-05-02 DIAGNOSIS — I10 Essential (primary) hypertension: Secondary | ICD-10-CM | POA: Diagnosis not present

## 2021-06-13 DIAGNOSIS — H401131 Primary open-angle glaucoma, bilateral, mild stage: Secondary | ICD-10-CM | POA: Diagnosis not present

## 2021-06-13 DIAGNOSIS — H18831 Recurrent erosion of cornea, right eye: Secondary | ICD-10-CM | POA: Diagnosis not present

## 2021-06-22 ENCOUNTER — Other Ambulatory Visit: Payer: Self-pay | Admitting: Internal Medicine

## 2021-06-22 DIAGNOSIS — Z1231 Encounter for screening mammogram for malignant neoplasm of breast: Secondary | ICD-10-CM

## 2021-07-02 ENCOUNTER — Ambulatory Visit: Payer: Medicare Other

## 2021-07-03 ENCOUNTER — Ambulatory Visit
Admission: RE | Admit: 2021-07-03 | Discharge: 2021-07-03 | Disposition: A | Discharge status: Home / Self Care | Payer: Medicare Other | Source: Ambulatory Visit | Attending: Internal Medicine | Admitting: Internal Medicine

## 2021-07-03 ENCOUNTER — Other Ambulatory Visit: Payer: Self-pay

## 2021-07-03 DIAGNOSIS — Z1231 Encounter for screening mammogram for malignant neoplasm of breast: Secondary | ICD-10-CM

## 2021-07-17 DIAGNOSIS — G4700 Insomnia, unspecified: Secondary | ICD-10-CM | POA: Diagnosis not present

## 2021-07-17 DIAGNOSIS — I10 Essential (primary) hypertension: Secondary | ICD-10-CM | POA: Diagnosis not present

## 2021-07-17 DIAGNOSIS — K219 Gastro-esophageal reflux disease without esophagitis: Secondary | ICD-10-CM | POA: Diagnosis not present

## 2021-07-17 DIAGNOSIS — E7800 Pure hypercholesterolemia, unspecified: Secondary | ICD-10-CM | POA: Diagnosis not present

## 2021-07-17 DIAGNOSIS — J4520 Mild intermittent asthma, uncomplicated: Secondary | ICD-10-CM | POA: Diagnosis not present

## 2021-07-26 DIAGNOSIS — I10 Essential (primary) hypertension: Secondary | ICD-10-CM | POA: Diagnosis not present

## 2021-07-26 DIAGNOSIS — F5101 Primary insomnia: Secondary | ICD-10-CM | POA: Diagnosis not present

## 2021-09-19 ENCOUNTER — Other Ambulatory Visit: Payer: Self-pay

## 2021-09-19 ENCOUNTER — Other Ambulatory Visit: Payer: Self-pay | Admitting: Internal Medicine

## 2021-09-19 ENCOUNTER — Ambulatory Visit
Admission: RE | Admit: 2021-09-19 | Discharge: 2021-09-19 | Disposition: A | Discharge status: Home / Self Care | Payer: Medicare Other | Source: Ambulatory Visit | Attending: Internal Medicine | Admitting: Internal Medicine

## 2021-09-19 DIAGNOSIS — M25551 Pain in right hip: Secondary | ICD-10-CM | POA: Diagnosis not present

## 2021-10-02 DIAGNOSIS — M461 Sacroiliitis, not elsewhere classified: Secondary | ICD-10-CM | POA: Diagnosis not present

## 2021-10-11 DIAGNOSIS — M542 Cervicalgia: Secondary | ICD-10-CM | POA: Diagnosis not present

## 2021-10-17 DIAGNOSIS — R400 Somnolence: Secondary | ICD-10-CM | POA: Diagnosis not present

## 2021-10-17 DIAGNOSIS — G4733 Obstructive sleep apnea (adult) (pediatric): Secondary | ICD-10-CM | POA: Diagnosis not present

## 2021-10-17 DIAGNOSIS — G4710 Hypersomnia, unspecified: Secondary | ICD-10-CM | POA: Diagnosis not present

## 2021-10-17 DIAGNOSIS — I10 Essential (primary) hypertension: Secondary | ICD-10-CM | POA: Diagnosis not present

## 2021-10-17 DIAGNOSIS — R5383 Other fatigue: Secondary | ICD-10-CM | POA: Diagnosis not present

## 2021-10-18 DIAGNOSIS — I10 Essential (primary) hypertension: Secondary | ICD-10-CM | POA: Diagnosis not present

## 2021-10-18 DIAGNOSIS — G4733 Obstructive sleep apnea (adult) (pediatric): Secondary | ICD-10-CM | POA: Diagnosis not present

## 2021-10-18 DIAGNOSIS — G4710 Hypersomnia, unspecified: Secondary | ICD-10-CM | POA: Diagnosis not present

## 2021-10-18 DIAGNOSIS — R5383 Other fatigue: Secondary | ICD-10-CM | POA: Diagnosis not present

## 2021-10-18 DIAGNOSIS — R400 Somnolence: Secondary | ICD-10-CM | POA: Diagnosis not present

## 2021-11-08 DIAGNOSIS — E7800 Pure hypercholesterolemia, unspecified: Secondary | ICD-10-CM | POA: Diagnosis not present

## 2021-11-08 DIAGNOSIS — I10 Essential (primary) hypertension: Secondary | ICD-10-CM | POA: Diagnosis not present

## 2021-11-17 DIAGNOSIS — G4710 Hypersomnia, unspecified: Secondary | ICD-10-CM | POA: Diagnosis not present

## 2021-11-17 DIAGNOSIS — R5383 Other fatigue: Secondary | ICD-10-CM | POA: Diagnosis not present

## 2021-11-17 DIAGNOSIS — G4733 Obstructive sleep apnea (adult) (pediatric): Secondary | ICD-10-CM | POA: Diagnosis not present

## 2021-11-17 DIAGNOSIS — R400 Somnolence: Secondary | ICD-10-CM | POA: Diagnosis not present

## 2021-11-17 DIAGNOSIS — I10 Essential (primary) hypertension: Secondary | ICD-10-CM | POA: Diagnosis not present

## 2021-12-06 DIAGNOSIS — M25562 Pain in left knee: Secondary | ICD-10-CM | POA: Diagnosis not present

## 2021-12-15 DIAGNOSIS — R5383 Other fatigue: Secondary | ICD-10-CM | POA: Diagnosis not present

## 2021-12-15 DIAGNOSIS — G4710 Hypersomnia, unspecified: Secondary | ICD-10-CM | POA: Diagnosis not present

## 2021-12-15 DIAGNOSIS — G4733 Obstructive sleep apnea (adult) (pediatric): Secondary | ICD-10-CM | POA: Diagnosis not present

## 2021-12-15 DIAGNOSIS — R400 Somnolence: Secondary | ICD-10-CM | POA: Diagnosis not present

## 2021-12-15 DIAGNOSIS — I10 Essential (primary) hypertension: Secondary | ICD-10-CM | POA: Diagnosis not present

## 2021-12-16 DIAGNOSIS — M25562 Pain in left knee: Secondary | ICD-10-CM | POA: Diagnosis not present

## 2021-12-19 DIAGNOSIS — H401131 Primary open-angle glaucoma, bilateral, mild stage: Secondary | ICD-10-CM | POA: Diagnosis not present

## 2021-12-19 DIAGNOSIS — H2513 Age-related nuclear cataract, bilateral: Secondary | ICD-10-CM | POA: Diagnosis not present

## 2021-12-19 DIAGNOSIS — H25041 Posterior subcapsular polar age-related cataract, right eye: Secondary | ICD-10-CM | POA: Diagnosis not present

## 2021-12-19 DIAGNOSIS — H524 Presbyopia: Secondary | ICD-10-CM | POA: Diagnosis not present

## 2021-12-27 DIAGNOSIS — M1712 Unilateral primary osteoarthritis, left knee: Secondary | ICD-10-CM | POA: Diagnosis not present

## 2022-01-15 DIAGNOSIS — I10 Essential (primary) hypertension: Secondary | ICD-10-CM | POA: Diagnosis not present

## 2022-01-15 DIAGNOSIS — R400 Somnolence: Secondary | ICD-10-CM | POA: Diagnosis not present

## 2022-01-15 DIAGNOSIS — R5383 Other fatigue: Secondary | ICD-10-CM | POA: Diagnosis not present

## 2022-01-15 DIAGNOSIS — G4733 Obstructive sleep apnea (adult) (pediatric): Secondary | ICD-10-CM | POA: Diagnosis not present

## 2022-01-15 DIAGNOSIS — G4710 Hypersomnia, unspecified: Secondary | ICD-10-CM | POA: Diagnosis not present

## 2022-01-16 DIAGNOSIS — G4710 Hypersomnia, unspecified: Secondary | ICD-10-CM | POA: Diagnosis not present

## 2022-01-16 DIAGNOSIS — I10 Essential (primary) hypertension: Secondary | ICD-10-CM | POA: Diagnosis not present

## 2022-01-16 DIAGNOSIS — F5101 Primary insomnia: Secondary | ICD-10-CM | POA: Diagnosis not present

## 2022-01-16 DIAGNOSIS — G4733 Obstructive sleep apnea (adult) (pediatric): Secondary | ICD-10-CM | POA: Diagnosis not present

## 2022-02-14 DIAGNOSIS — R5383 Other fatigue: Secondary | ICD-10-CM | POA: Diagnosis not present

## 2022-02-14 DIAGNOSIS — G4733 Obstructive sleep apnea (adult) (pediatric): Secondary | ICD-10-CM | POA: Diagnosis not present

## 2022-02-14 DIAGNOSIS — I10 Essential (primary) hypertension: Secondary | ICD-10-CM | POA: Diagnosis not present

## 2022-02-14 DIAGNOSIS — R400 Somnolence: Secondary | ICD-10-CM | POA: Diagnosis not present

## 2022-02-14 DIAGNOSIS — G4710 Hypersomnia, unspecified: Secondary | ICD-10-CM | POA: Diagnosis not present

## 2022-03-06 DIAGNOSIS — R7301 Impaired fasting glucose: Secondary | ICD-10-CM | POA: Diagnosis not present

## 2022-03-06 DIAGNOSIS — M109 Gout, unspecified: Secondary | ICD-10-CM | POA: Diagnosis not present

## 2022-03-06 DIAGNOSIS — G4733 Obstructive sleep apnea (adult) (pediatric): Secondary | ICD-10-CM | POA: Diagnosis not present

## 2022-03-06 DIAGNOSIS — J4520 Mild intermittent asthma, uncomplicated: Secondary | ICD-10-CM | POA: Diagnosis not present

## 2022-03-06 DIAGNOSIS — Z0000 Encounter for general adult medical examination without abnormal findings: Secondary | ICD-10-CM | POA: Diagnosis not present

## 2022-03-06 DIAGNOSIS — K219 Gastro-esophageal reflux disease without esophagitis: Secondary | ICD-10-CM | POA: Diagnosis not present

## 2022-03-06 DIAGNOSIS — E7800 Pure hypercholesterolemia, unspecified: Secondary | ICD-10-CM | POA: Diagnosis not present

## 2022-03-06 DIAGNOSIS — I10 Essential (primary) hypertension: Secondary | ICD-10-CM | POA: Diagnosis not present

## 2022-03-17 DIAGNOSIS — R400 Somnolence: Secondary | ICD-10-CM | POA: Diagnosis not present

## 2022-03-17 DIAGNOSIS — I10 Essential (primary) hypertension: Secondary | ICD-10-CM | POA: Diagnosis not present

## 2022-03-17 DIAGNOSIS — R5383 Other fatigue: Secondary | ICD-10-CM | POA: Diagnosis not present

## 2022-03-17 DIAGNOSIS — G4733 Obstructive sleep apnea (adult) (pediatric): Secondary | ICD-10-CM | POA: Diagnosis not present

## 2022-03-17 DIAGNOSIS — G4710 Hypersomnia, unspecified: Secondary | ICD-10-CM | POA: Diagnosis not present

## 2022-03-24 IMAGING — CT CT CARDIAC CORONARY ARTERY CALCIUM SCORE
3 series · 14 of 20 positions shown, 16 images · non-contrast
Comparison: None.

CLINICAL DATA: Hyperlipidemia

EXAM:
CT CARDIAC CORONARY ARTERY CALCIUM SCORE
TECHNIQUE: Non-contrast imaging through the heart was performed using
prospective ECG gating. Image post processing was performed on an
independent workstation, allowing for quantitative analysis of the
heart and coronary arteries. Note that this exam targets the heart
and the chest was not imaged in its entirety.

[Series 2: calcium scoring 2.00 qr36 bestdiast 69% hrt calciu · axial · 0.42mm/px · z∈[+1739,+1823]mm · 4 of 70 slices shown]
[im 14/70  vessel]
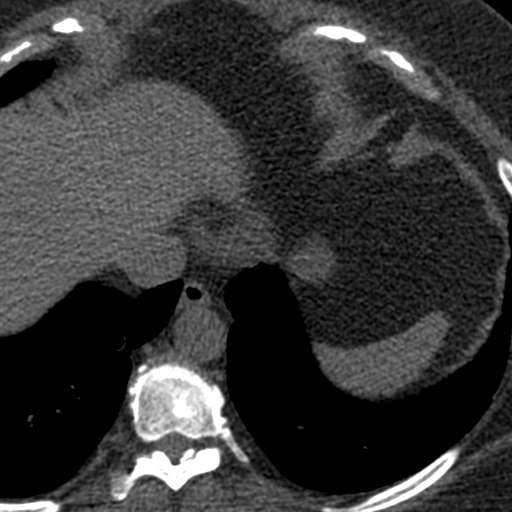
[im 28/70  vessel]
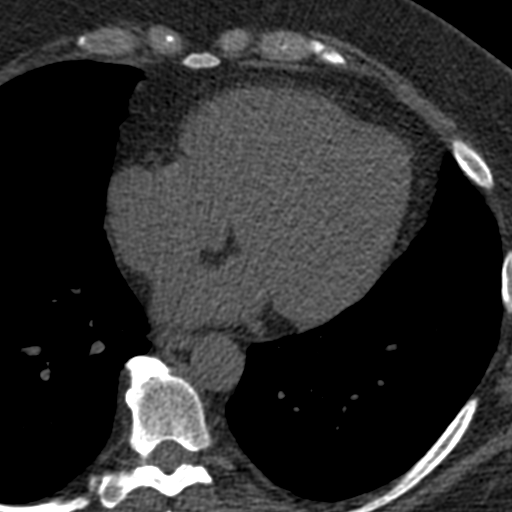
[im 42/70  vessel]
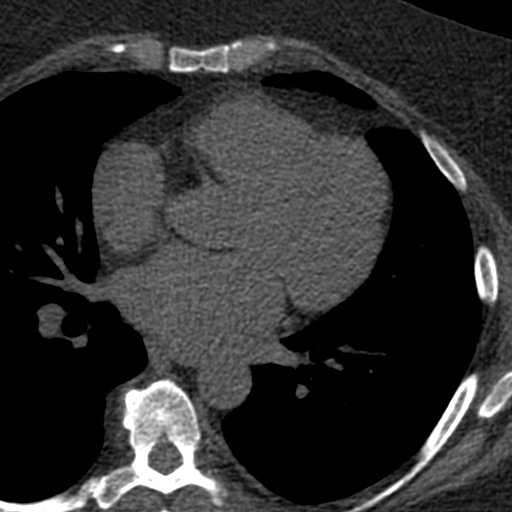
[im 56/70  vessel]
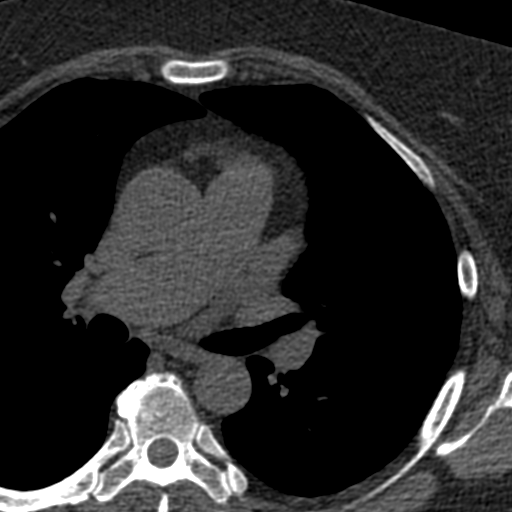

[Series 3: calcium scoring 2.00 br40 bestdiast 69% axial · axial · 0.60mm/px · z∈[+1735,+1827]mm · 5 of 70 slices shown, 7 images]
[im 12/70  vessel]
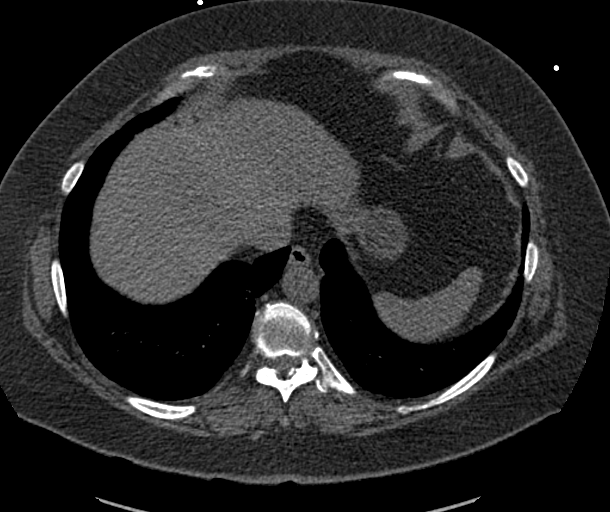
[im 12/70  lung]
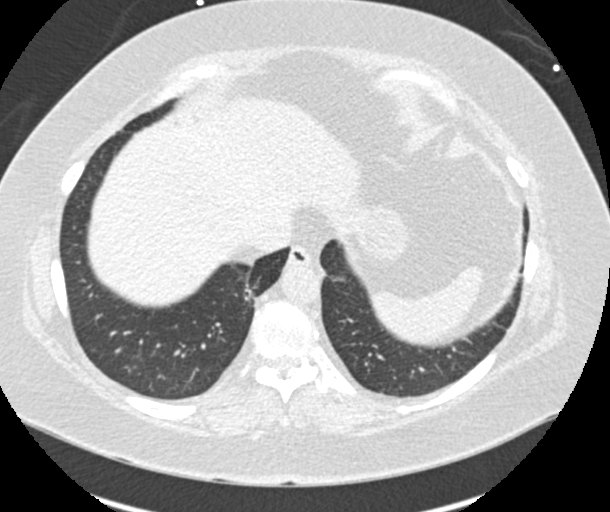
[im 24/70  vessel]
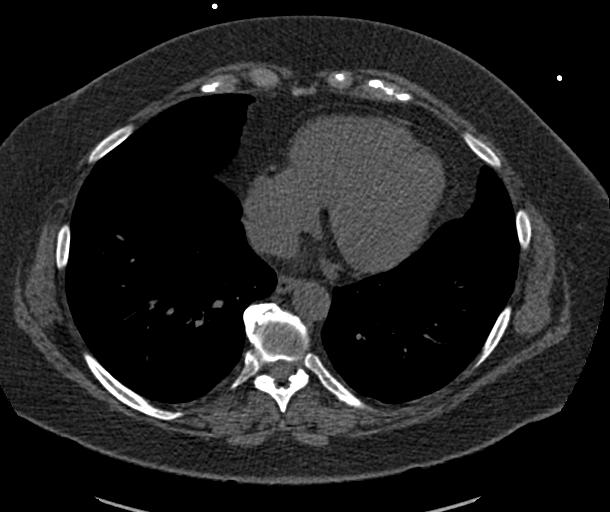
[im 35/70  vessel]
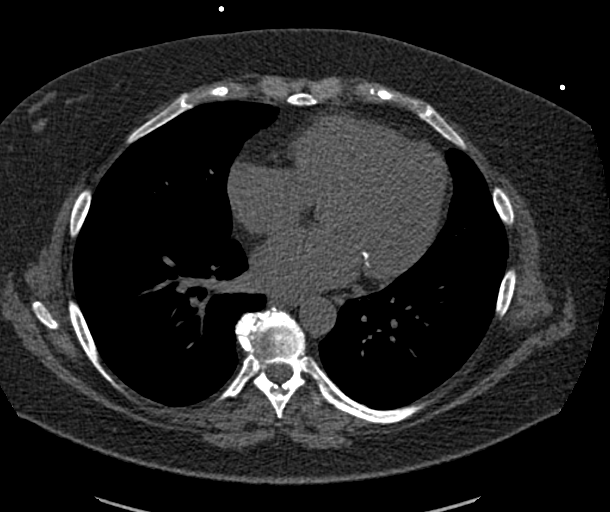
[im 47/70  vessel]
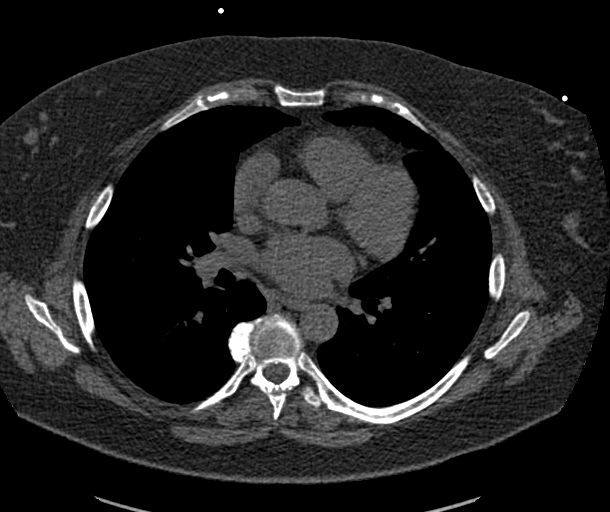
[im 58/70  vessel]
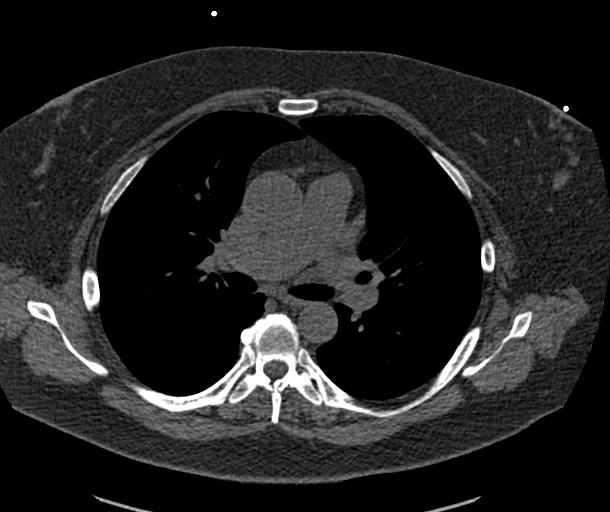
[im 58/70  lung]
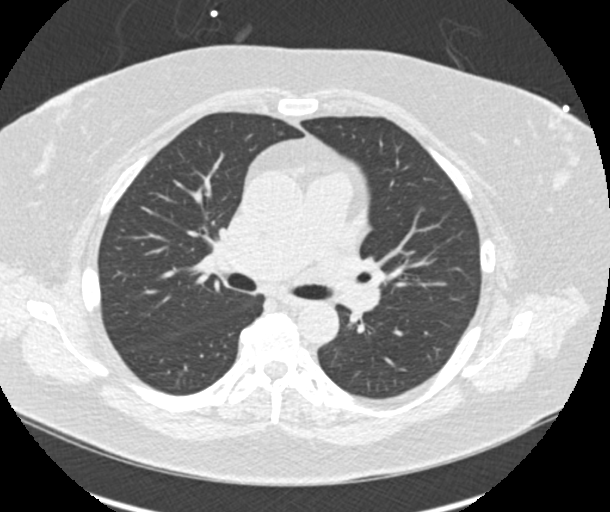

[Series 9: calcium scoring 2.00 br60 bestdiast 69% lungs · axial · 0.63mm/px · z∈[+1735,+1827]mm · 5 of 70 slices shown]
[im 12/70  vessel]
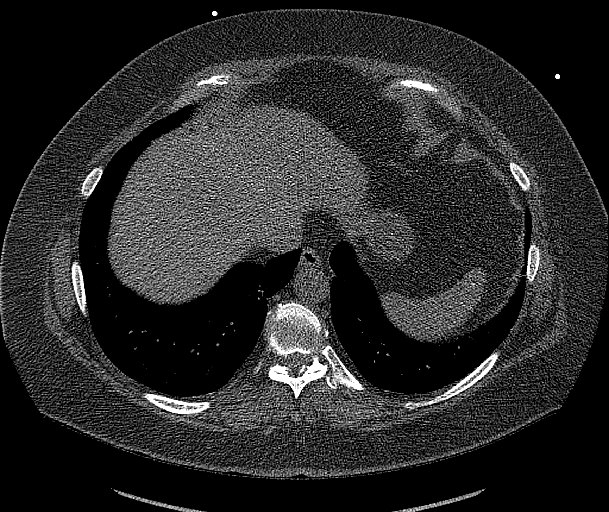
[im 24/70  vessel]
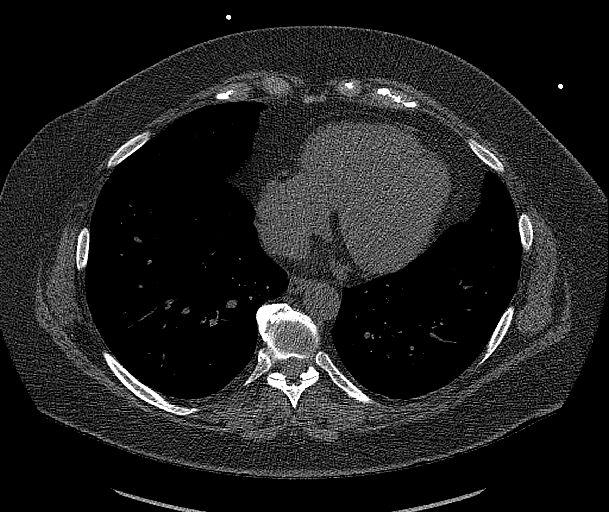
[im 35/70  vessel]
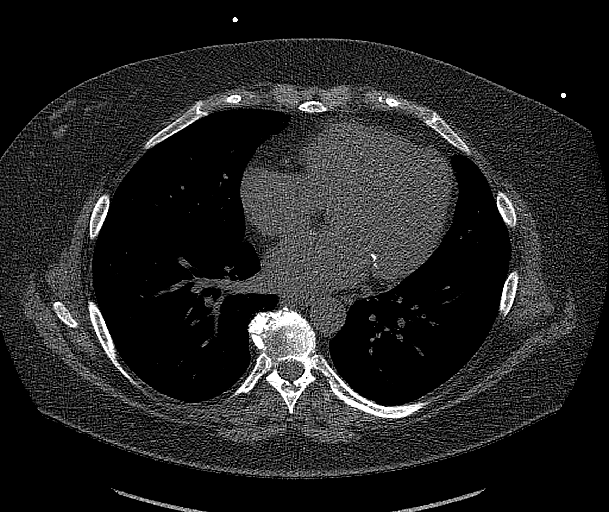
[im 47/70  vessel]
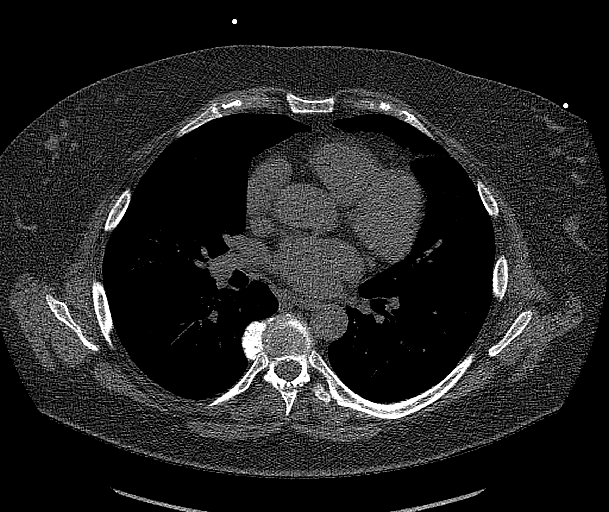
[im 58/70  vessel]
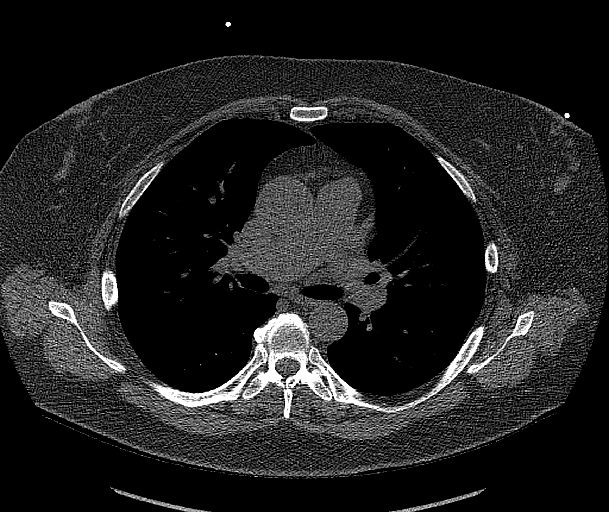

[14 of 20 positions shown; findings below may reference images not displayed]

FINDINGS: CORONARY CALCIUM SCORES:

Left Main: 0

LAD: 0

LCx: 0

RCA: 0

Total Agatston Score: 0

[HOSPITAL] percentile: 0

AORTA MEASUREMENTS:

Ascending Aorta: 34 mm

Descending Aorta: 23 mm

OTHER FINDINGS:

The heart is normal size. Mitral valve annular calcifications noted.
Aorta normal caliber. No adenopathy. No confluent opacities or
effusions. Imaging into the upper abdomen demonstrates no acute
findings. Chest wall soft tissues are unremarkable. No acute bony
abnormality.
IMPRESSION: No visible coronary artery calcifications. Total coronary calcium
score of 0.

No acute extra cardiac abnormality.

## 2022-04-16 DIAGNOSIS — G4710 Hypersomnia, unspecified: Secondary | ICD-10-CM | POA: Diagnosis not present

## 2022-04-16 DIAGNOSIS — R5383 Other fatigue: Secondary | ICD-10-CM | POA: Diagnosis not present

## 2022-04-16 DIAGNOSIS — I10 Essential (primary) hypertension: Secondary | ICD-10-CM | POA: Diagnosis not present

## 2022-04-16 DIAGNOSIS — G4733 Obstructive sleep apnea (adult) (pediatric): Secondary | ICD-10-CM | POA: Diagnosis not present

## 2022-04-16 DIAGNOSIS — R400 Somnolence: Secondary | ICD-10-CM | POA: Diagnosis not present

## 2022-05-14 DIAGNOSIS — I10 Essential (primary) hypertension: Secondary | ICD-10-CM | POA: Diagnosis not present

## 2022-05-14 DIAGNOSIS — G4733 Obstructive sleep apnea (adult) (pediatric): Secondary | ICD-10-CM | POA: Diagnosis not present

## 2022-05-14 DIAGNOSIS — G4710 Hypersomnia, unspecified: Secondary | ICD-10-CM | POA: Diagnosis not present

## 2022-05-14 DIAGNOSIS — F5101 Primary insomnia: Secondary | ICD-10-CM | POA: Diagnosis not present

## 2022-05-17 DIAGNOSIS — G4710 Hypersomnia, unspecified: Secondary | ICD-10-CM | POA: Diagnosis not present

## 2022-05-17 DIAGNOSIS — I10 Essential (primary) hypertension: Secondary | ICD-10-CM | POA: Diagnosis not present

## 2022-05-17 DIAGNOSIS — R5383 Other fatigue: Secondary | ICD-10-CM | POA: Diagnosis not present

## 2022-05-17 DIAGNOSIS — G4733 Obstructive sleep apnea (adult) (pediatric): Secondary | ICD-10-CM | POA: Diagnosis not present

## 2022-05-17 DIAGNOSIS — R400 Somnolence: Secondary | ICD-10-CM | POA: Diagnosis not present

## 2022-05-20 DIAGNOSIS — I10 Essential (primary) hypertension: Secondary | ICD-10-CM | POA: Diagnosis not present

## 2022-05-20 DIAGNOSIS — E7800 Pure hypercholesterolemia, unspecified: Secondary | ICD-10-CM | POA: Diagnosis not present

## 2022-05-20 DIAGNOSIS — G4733 Obstructive sleep apnea (adult) (pediatric): Secondary | ICD-10-CM | POA: Diagnosis not present

## 2022-05-20 DIAGNOSIS — R7301 Impaired fasting glucose: Secondary | ICD-10-CM | POA: Diagnosis not present

## 2022-06-03 DIAGNOSIS — R7301 Impaired fasting glucose: Secondary | ICD-10-CM | POA: Diagnosis not present

## 2022-06-03 DIAGNOSIS — I10 Essential (primary) hypertension: Secondary | ICD-10-CM | POA: Diagnosis not present

## 2022-06-03 DIAGNOSIS — E7800 Pure hypercholesterolemia, unspecified: Secondary | ICD-10-CM | POA: Diagnosis not present

## 2022-06-17 DIAGNOSIS — G4710 Hypersomnia, unspecified: Secondary | ICD-10-CM | POA: Diagnosis not present

## 2022-06-17 DIAGNOSIS — R5383 Other fatigue: Secondary | ICD-10-CM | POA: Diagnosis not present

## 2022-06-17 DIAGNOSIS — I10 Essential (primary) hypertension: Secondary | ICD-10-CM | POA: Diagnosis not present

## 2022-06-17 DIAGNOSIS — R400 Somnolence: Secondary | ICD-10-CM | POA: Diagnosis not present

## 2022-06-17 DIAGNOSIS — G4733 Obstructive sleep apnea (adult) (pediatric): Secondary | ICD-10-CM | POA: Diagnosis not present

## 2022-06-24 DIAGNOSIS — H25041 Posterior subcapsular polar age-related cataract, right eye: Secondary | ICD-10-CM | POA: Diagnosis not present

## 2022-06-24 DIAGNOSIS — H2513 Age-related nuclear cataract, bilateral: Secondary | ICD-10-CM | POA: Diagnosis not present

## 2022-06-24 DIAGNOSIS — H401131 Primary open-angle glaucoma, bilateral, mild stage: Secondary | ICD-10-CM | POA: Diagnosis not present

## 2022-06-24 DIAGNOSIS — H43813 Vitreous degeneration, bilateral: Secondary | ICD-10-CM | POA: Diagnosis not present

## 2022-06-26 DIAGNOSIS — R7301 Impaired fasting glucose: Secondary | ICD-10-CM | POA: Diagnosis not present

## 2022-06-26 DIAGNOSIS — I10 Essential (primary) hypertension: Secondary | ICD-10-CM | POA: Diagnosis not present

## 2022-06-26 DIAGNOSIS — E7800 Pure hypercholesterolemia, unspecified: Secondary | ICD-10-CM | POA: Diagnosis not present

## 2022-07-01 ENCOUNTER — Other Ambulatory Visit: Payer: Self-pay | Admitting: Internal Medicine

## 2022-07-01 DIAGNOSIS — Z1231 Encounter for screening mammogram for malignant neoplasm of breast: Secondary | ICD-10-CM

## 2022-07-15 DIAGNOSIS — H43811 Vitreous degeneration, right eye: Secondary | ICD-10-CM | POA: Diagnosis not present

## 2022-07-15 DIAGNOSIS — H31001 Unspecified chorioretinal scars, right eye: Secondary | ICD-10-CM | POA: Diagnosis not present

## 2022-07-17 DIAGNOSIS — R400 Somnolence: Secondary | ICD-10-CM | POA: Diagnosis not present

## 2022-07-17 DIAGNOSIS — G4733 Obstructive sleep apnea (adult) (pediatric): Secondary | ICD-10-CM | POA: Diagnosis not present

## 2022-07-17 DIAGNOSIS — H43811 Vitreous degeneration, right eye: Secondary | ICD-10-CM | POA: Diagnosis not present

## 2022-07-17 DIAGNOSIS — G4710 Hypersomnia, unspecified: Secondary | ICD-10-CM | POA: Diagnosis not present

## 2022-07-17 DIAGNOSIS — R5383 Other fatigue: Secondary | ICD-10-CM | POA: Diagnosis not present

## 2022-07-17 DIAGNOSIS — H31001 Unspecified chorioretinal scars, right eye: Secondary | ICD-10-CM | POA: Diagnosis not present

## 2022-07-17 DIAGNOSIS — I10 Essential (primary) hypertension: Secondary | ICD-10-CM | POA: Diagnosis not present

## 2022-07-23 ENCOUNTER — Ambulatory Visit
Admission: RE | Admit: 2022-07-23 | Discharge: 2022-07-23 | Disposition: A | Discharge status: Home / Self Care | Payer: Medicare Other | Source: Ambulatory Visit | Attending: Internal Medicine | Admitting: Internal Medicine

## 2022-07-23 DIAGNOSIS — Z1231 Encounter for screening mammogram for malignant neoplasm of breast: Secondary | ICD-10-CM | POA: Diagnosis not present

## 2022-07-23 DIAGNOSIS — F5101 Primary insomnia: Secondary | ICD-10-CM | POA: Diagnosis not present

## 2022-07-23 DIAGNOSIS — I10 Essential (primary) hypertension: Secondary | ICD-10-CM | POA: Diagnosis not present

## 2022-07-23 DIAGNOSIS — R7301 Impaired fasting glucose: Secondary | ICD-10-CM | POA: Diagnosis not present

## 2022-08-27 ENCOUNTER — Other Ambulatory Visit: Payer: Self-pay | Admitting: Student

## 2022-08-27 DIAGNOSIS — M47816 Spondylosis without myelopathy or radiculopathy, lumbar region: Secondary | ICD-10-CM | POA: Diagnosis not present

## 2022-09-02 DIAGNOSIS — R7301 Impaired fasting glucose: Secondary | ICD-10-CM | POA: Diagnosis not present

## 2022-09-02 DIAGNOSIS — E7800 Pure hypercholesterolemia, unspecified: Secondary | ICD-10-CM | POA: Diagnosis not present

## 2022-09-02 DIAGNOSIS — K219 Gastro-esophageal reflux disease without esophagitis: Secondary | ICD-10-CM | POA: Diagnosis not present

## 2022-09-02 DIAGNOSIS — I10 Essential (primary) hypertension: Secondary | ICD-10-CM | POA: Diagnosis not present

## 2022-09-02 DIAGNOSIS — J4520 Mild intermittent asthma, uncomplicated: Secondary | ICD-10-CM | POA: Diagnosis not present

## 2022-09-03 DIAGNOSIS — I10 Essential (primary) hypertension: Secondary | ICD-10-CM | POA: Diagnosis not present

## 2022-09-03 DIAGNOSIS — E7800 Pure hypercholesterolemia, unspecified: Secondary | ICD-10-CM | POA: Diagnosis not present

## 2022-09-03 DIAGNOSIS — R7301 Impaired fasting glucose: Secondary | ICD-10-CM | POA: Diagnosis not present

## 2022-09-06 DIAGNOSIS — M25562 Pain in left knee: Secondary | ICD-10-CM | POA: Diagnosis not present

## 2022-09-08 ENCOUNTER — Ambulatory Visit
Admission: RE | Admit: 2022-09-08 | Discharge: 2022-09-08 | Disposition: A | Discharge status: Home / Self Care | Payer: Medicare Other | Source: Ambulatory Visit | Attending: Student | Admitting: Student

## 2022-09-08 DIAGNOSIS — M47816 Spondylosis without myelopathy or radiculopathy, lumbar region: Secondary | ICD-10-CM

## 2022-09-08 DIAGNOSIS — M545 Low back pain, unspecified: Secondary | ICD-10-CM | POA: Diagnosis not present

## 2022-09-18 ENCOUNTER — Other Ambulatory Visit: Payer: Medicare Other

## 2022-09-21 ENCOUNTER — Other Ambulatory Visit: Payer: Medicare Other

## 2022-09-24 DIAGNOSIS — M4316 Spondylolisthesis, lumbar region: Secondary | ICD-10-CM | POA: Diagnosis not present

## 2022-10-09 DIAGNOSIS — E7800 Pure hypercholesterolemia, unspecified: Secondary | ICD-10-CM | POA: Diagnosis not present

## 2022-10-09 DIAGNOSIS — R7301 Impaired fasting glucose: Secondary | ICD-10-CM | POA: Diagnosis not present

## 2022-10-09 DIAGNOSIS — I10 Essential (primary) hypertension: Secondary | ICD-10-CM | POA: Diagnosis not present

## 2022-11-14 DIAGNOSIS — I10 Essential (primary) hypertension: Secondary | ICD-10-CM | POA: Diagnosis not present

## 2022-11-14 DIAGNOSIS — E7800 Pure hypercholesterolemia, unspecified: Secondary | ICD-10-CM | POA: Diagnosis not present

## 2022-11-14 DIAGNOSIS — R7301 Impaired fasting glucose: Secondary | ICD-10-CM | POA: Diagnosis not present

## 2022-11-25 DIAGNOSIS — H31001 Unspecified chorioretinal scars, right eye: Secondary | ICD-10-CM | POA: Diagnosis not present

## 2022-11-25 DIAGNOSIS — H401131 Primary open-angle glaucoma, bilateral, mild stage: Secondary | ICD-10-CM | POA: Diagnosis not present

## 2022-11-25 DIAGNOSIS — H43813 Vitreous degeneration, bilateral: Secondary | ICD-10-CM | POA: Diagnosis not present

## 2022-11-25 DIAGNOSIS — H2513 Age-related nuclear cataract, bilateral: Secondary | ICD-10-CM | POA: Diagnosis not present

## 2022-12-23 DIAGNOSIS — I10 Essential (primary) hypertension: Secondary | ICD-10-CM | POA: Diagnosis not present

## 2022-12-23 DIAGNOSIS — E7800 Pure hypercholesterolemia, unspecified: Secondary | ICD-10-CM | POA: Diagnosis not present

## 2022-12-23 DIAGNOSIS — R7301 Impaired fasting glucose: Secondary | ICD-10-CM | POA: Diagnosis not present

## 2022-12-26 DIAGNOSIS — L258 Unspecified contact dermatitis due to other agents: Secondary | ICD-10-CM | POA: Diagnosis not present

## 2022-12-26 DIAGNOSIS — L821 Other seborrheic keratosis: Secondary | ICD-10-CM | POA: Diagnosis not present

## 2022-12-26 DIAGNOSIS — Z1283 Encounter for screening for malignant neoplasm of skin: Secondary | ICD-10-CM | POA: Diagnosis not present

## 2023-01-23 DIAGNOSIS — M1712 Unilateral primary osteoarthritis, left knee: Secondary | ICD-10-CM | POA: Diagnosis not present

## 2023-02-05 DIAGNOSIS — E7800 Pure hypercholesterolemia, unspecified: Secondary | ICD-10-CM | POA: Diagnosis not present

## 2023-02-05 DIAGNOSIS — I10 Essential (primary) hypertension: Secondary | ICD-10-CM | POA: Diagnosis not present

## 2023-02-05 DIAGNOSIS — R7301 Impaired fasting glucose: Secondary | ICD-10-CM | POA: Diagnosis not present

## 2023-04-07 DIAGNOSIS — R7301 Impaired fasting glucose: Secondary | ICD-10-CM | POA: Diagnosis not present

## 2023-04-07 DIAGNOSIS — I10 Essential (primary) hypertension: Secondary | ICD-10-CM | POA: Diagnosis not present

## 2023-04-07 DIAGNOSIS — E7800 Pure hypercholesterolemia, unspecified: Secondary | ICD-10-CM | POA: Diagnosis not present

## 2023-07-07 ENCOUNTER — Other Ambulatory Visit: Payer: Self-pay | Admitting: Internal Medicine

## 2023-07-07 DIAGNOSIS — Z1231 Encounter for screening mammogram for malignant neoplasm of breast: Secondary | ICD-10-CM

## 2023-07-31 ENCOUNTER — Other Ambulatory Visit: Payer: Self-pay | Admitting: Internal Medicine

## 2023-07-31 ENCOUNTER — Ambulatory Visit
Admission: RE | Admit: 2023-07-31 | Discharge: 2023-07-31 | Disposition: A | Discharge status: Home / Self Care | Payer: Medicare Other | Source: Ambulatory Visit | Attending: Internal Medicine | Admitting: Internal Medicine

## 2023-07-31 DIAGNOSIS — Z1231 Encounter for screening mammogram for malignant neoplasm of breast: Secondary | ICD-10-CM | POA: Diagnosis not present

## 2023-07-31 DIAGNOSIS — Z780 Asymptomatic menopausal state: Secondary | ICD-10-CM

## 2023-08-05 ENCOUNTER — Other Ambulatory Visit: Payer: Self-pay | Admitting: Internal Medicine

## 2023-08-05 DIAGNOSIS — R928 Other abnormal and inconclusive findings on diagnostic imaging of breast: Secondary | ICD-10-CM

## 2023-08-19 ENCOUNTER — Ambulatory Visit
Admission: RE | Admit: 2023-08-19 | Discharge: 2023-08-19 | Disposition: A | Discharge status: Home / Self Care | Payer: Medicare Other | Source: Ambulatory Visit | Attending: Internal Medicine | Admitting: Internal Medicine

## 2023-08-19 DIAGNOSIS — R928 Other abnormal and inconclusive findings on diagnostic imaging of breast: Secondary | ICD-10-CM

## 2023-08-19 DIAGNOSIS — R921 Mammographic calcification found on diagnostic imaging of breast: Secondary | ICD-10-CM | POA: Diagnosis not present

## 2023-09-30 ENCOUNTER — Ambulatory Visit: Payer: Medicare Other | Admitting: Obstetrics

## 2023-10-02 DIAGNOSIS — M1712 Unilateral primary osteoarthritis, left knee: Secondary | ICD-10-CM | POA: Diagnosis not present

## 2023-10-20 DIAGNOSIS — E7800 Pure hypercholesterolemia, unspecified: Secondary | ICD-10-CM | POA: Diagnosis not present

## 2023-10-20 DIAGNOSIS — R7301 Impaired fasting glucose: Secondary | ICD-10-CM | POA: Diagnosis not present

## 2023-10-20 DIAGNOSIS — I10 Essential (primary) hypertension: Secondary | ICD-10-CM | POA: Diagnosis not present

## 2023-10-22 DIAGNOSIS — M25562 Pain in left knee: Secondary | ICD-10-CM | POA: Diagnosis not present

## 2023-10-27 DIAGNOSIS — Z1283 Encounter for screening for malignant neoplasm of skin: Secondary | ICD-10-CM | POA: Diagnosis not present

## 2023-10-27 DIAGNOSIS — D225 Melanocytic nevi of trunk: Secondary | ICD-10-CM | POA: Diagnosis not present

## 2023-10-30 DIAGNOSIS — S83242A Other tear of medial meniscus, current injury, left knee, initial encounter: Secondary | ICD-10-CM | POA: Diagnosis not present

## 2023-10-30 DIAGNOSIS — M6752 Plica syndrome, left knee: Secondary | ICD-10-CM | POA: Diagnosis not present

## 2023-10-30 DIAGNOSIS — M25562 Pain in left knee: Secondary | ICD-10-CM | POA: Diagnosis not present

## 2023-11-27 DIAGNOSIS — H401131 Primary open-angle glaucoma, bilateral, mild stage: Secondary | ICD-10-CM | POA: Diagnosis not present

## 2023-12-09 DIAGNOSIS — S83272A Complex tear of lateral meniscus, current injury, left knee, initial encounter: Secondary | ICD-10-CM | POA: Diagnosis not present

## 2023-12-09 DIAGNOSIS — S83212A Bucket-handle tear of medial meniscus, current injury, left knee, initial encounter: Secondary | ICD-10-CM | POA: Diagnosis not present

## 2023-12-09 DIAGNOSIS — M23322 Other meniscus derangements, posterior horn of medial meniscus, left knee: Secondary | ICD-10-CM | POA: Diagnosis not present

## 2023-12-09 DIAGNOSIS — M94262 Chondromalacia, left knee: Secondary | ICD-10-CM | POA: Diagnosis not present

## 2023-12-09 DIAGNOSIS — M958 Other specified acquired deformities of musculoskeletal system: Secondary | ICD-10-CM | POA: Diagnosis not present

## 2023-12-09 DIAGNOSIS — S83282A Other tear of lateral meniscus, current injury, left knee, initial encounter: Secondary | ICD-10-CM | POA: Diagnosis not present

## 2023-12-09 DIAGNOSIS — G8918 Other acute postprocedural pain: Secondary | ICD-10-CM | POA: Diagnosis not present

## 2023-12-10 DIAGNOSIS — H43812 Vitreous degeneration, left eye: Secondary | ICD-10-CM | POA: Diagnosis not present

## 2023-12-10 DIAGNOSIS — H3500 Unspecified background retinopathy: Secondary | ICD-10-CM | POA: Diagnosis not present

## 2023-12-11 ENCOUNTER — Ambulatory Visit: Payer: Medicare Other | Admitting: Obstetrics

## 2023-12-17 DIAGNOSIS — Z5189 Encounter for other specified aftercare: Secondary | ICD-10-CM | POA: Diagnosis not present

## 2023-12-17 DIAGNOSIS — M25462 Effusion, left knee: Secondary | ICD-10-CM | POA: Diagnosis not present

## 2023-12-24 DIAGNOSIS — H43812 Vitreous degeneration, left eye: Secondary | ICD-10-CM | POA: Diagnosis not present

## 2023-12-31 DIAGNOSIS — K5792 Diverticulitis of intestine, part unspecified, without perforation or abscess without bleeding: Secondary | ICD-10-CM | POA: Diagnosis not present

## 2024-01-13 DIAGNOSIS — I10 Essential (primary) hypertension: Secondary | ICD-10-CM | POA: Diagnosis not present

## 2024-01-13 DIAGNOSIS — Z8639 Personal history of other endocrine, nutritional and metabolic disease: Secondary | ICD-10-CM | POA: Diagnosis not present

## 2024-01-13 DIAGNOSIS — R7301 Impaired fasting glucose: Secondary | ICD-10-CM | POA: Diagnosis not present

## 2024-01-15 DIAGNOSIS — K5792 Diverticulitis of intestine, part unspecified, without perforation or abscess without bleeding: Secondary | ICD-10-CM | POA: Diagnosis not present

## 2024-01-15 DIAGNOSIS — Z860101 Personal history of adenomatous and serrated colon polyps: Secondary | ICD-10-CM | POA: Diagnosis not present

## 2024-01-26 ENCOUNTER — Encounter (HOSPITAL_BASED_OUTPATIENT_CLINIC_OR_DEPARTMENT_OTHER): Payer: Self-pay | Admitting: Internal Medicine

## 2024-01-26 DIAGNOSIS — R109 Unspecified abdominal pain: Secondary | ICD-10-CM | POA: Diagnosis not present

## 2024-01-27 ENCOUNTER — Ambulatory Visit (HOSPITAL_COMMUNITY)
Admission: RE | Admit: 2024-01-27 | Discharge: 2024-01-27 | Disposition: A | Discharge status: Home / Self Care | Source: Ambulatory Visit | Attending: Internal Medicine | Admitting: Internal Medicine

## 2024-01-27 ENCOUNTER — Other Ambulatory Visit (HOSPITAL_COMMUNITY): Payer: Self-pay | Admitting: Internal Medicine

## 2024-01-27 DIAGNOSIS — R109 Unspecified abdominal pain: Secondary | ICD-10-CM | POA: Diagnosis not present

## 2024-01-27 DIAGNOSIS — K5732 Diverticulitis of large intestine without perforation or abscess without bleeding: Secondary | ICD-10-CM | POA: Diagnosis not present

## 2024-01-27 DIAGNOSIS — K769 Liver disease, unspecified: Secondary | ICD-10-CM | POA: Diagnosis not present

## 2024-02-05 DIAGNOSIS — M25562 Pain in left knee: Secondary | ICD-10-CM | POA: Diagnosis not present

## 2024-03-16 DIAGNOSIS — K5730 Diverticulosis of large intestine without perforation or abscess without bleeding: Secondary | ICD-10-CM | POA: Diagnosis not present

## 2024-03-16 DIAGNOSIS — Z860101 Personal history of adenomatous and serrated colon polyps: Secondary | ICD-10-CM | POA: Diagnosis not present

## 2024-03-16 DIAGNOSIS — Z09 Encounter for follow-up examination after completed treatment for conditions other than malignant neoplasm: Secondary | ICD-10-CM | POA: Diagnosis not present

## 2024-03-18 ENCOUNTER — Ambulatory Visit (HOSPITAL_COMMUNITY)
Admission: RE | Admit: 2024-03-18 | Discharge: 2024-03-18 | Disposition: A | Discharge status: Home / Self Care | Source: Ambulatory Visit | Attending: Internal Medicine | Admitting: Internal Medicine

## 2024-03-18 ENCOUNTER — Other Ambulatory Visit: Payer: Medicare Other

## 2024-03-18 DIAGNOSIS — Z780 Asymptomatic menopausal state: Secondary | ICD-10-CM | POA: Diagnosis not present

## 2024-04-08 DIAGNOSIS — Z5189 Encounter for other specified aftercare: Secondary | ICD-10-CM | POA: Diagnosis not present

## 2024-04-08 DIAGNOSIS — M1712 Unilateral primary osteoarthritis, left knee: Secondary | ICD-10-CM | POA: Diagnosis not present

## 2024-04-22 DIAGNOSIS — R7301 Impaired fasting glucose: Secondary | ICD-10-CM | POA: Diagnosis not present

## 2024-04-22 DIAGNOSIS — I10 Essential (primary) hypertension: Secondary | ICD-10-CM | POA: Diagnosis not present

## 2024-05-27 DIAGNOSIS — Z1283 Encounter for screening for malignant neoplasm of skin: Secondary | ICD-10-CM | POA: Diagnosis not present

## 2024-05-27 DIAGNOSIS — D225 Melanocytic nevi of trunk: Secondary | ICD-10-CM | POA: Diagnosis not present

## 2024-05-28 DIAGNOSIS — H43813 Vitreous degeneration, bilateral: Secondary | ICD-10-CM | POA: Diagnosis not present

## 2024-05-28 DIAGNOSIS — H26493 Other secondary cataract, bilateral: Secondary | ICD-10-CM | POA: Diagnosis not present

## 2024-05-28 DIAGNOSIS — H04123 Dry eye syndrome of bilateral lacrimal glands: Secondary | ICD-10-CM | POA: Diagnosis not present

## 2024-05-28 DIAGNOSIS — H401131 Primary open-angle glaucoma, bilateral, mild stage: Secondary | ICD-10-CM | POA: Diagnosis not present

## 2024-06-01 DIAGNOSIS — J4520 Mild intermittent asthma, uncomplicated: Secondary | ICD-10-CM | POA: Diagnosis not present

## 2024-06-01 DIAGNOSIS — E7800 Pure hypercholesterolemia, unspecified: Secondary | ICD-10-CM | POA: Diagnosis not present

## 2024-06-01 DIAGNOSIS — K219 Gastro-esophageal reflux disease without esophagitis: Secondary | ICD-10-CM | POA: Diagnosis not present

## 2024-06-01 DIAGNOSIS — Z23 Encounter for immunization: Secondary | ICD-10-CM | POA: Diagnosis not present

## 2024-06-01 DIAGNOSIS — R002 Palpitations: Secondary | ICD-10-CM | POA: Diagnosis not present

## 2024-06-01 DIAGNOSIS — R7301 Impaired fasting glucose: Secondary | ICD-10-CM | POA: Diagnosis not present

## 2024-06-01 DIAGNOSIS — M545 Low back pain, unspecified: Secondary | ICD-10-CM | POA: Diagnosis not present

## 2024-06-01 DIAGNOSIS — F5101 Primary insomnia: Secondary | ICD-10-CM | POA: Diagnosis not present

## 2024-06-01 DIAGNOSIS — Z0000 Encounter for general adult medical examination without abnormal findings: Secondary | ICD-10-CM | POA: Diagnosis not present

## 2024-06-01 DIAGNOSIS — Z8739 Personal history of other diseases of the musculoskeletal system and connective tissue: Secondary | ICD-10-CM | POA: Diagnosis not present

## 2024-06-01 DIAGNOSIS — I10 Essential (primary) hypertension: Secondary | ICD-10-CM | POA: Diagnosis not present

## 2024-06-01 DIAGNOSIS — G4733 Obstructive sleep apnea (adult) (pediatric): Secondary | ICD-10-CM | POA: Diagnosis not present

## 2024-08-04 ENCOUNTER — Other Ambulatory Visit: Payer: Self-pay | Admitting: Internal Medicine

## 2024-08-04 DIAGNOSIS — Z1231 Encounter for screening mammogram for malignant neoplasm of breast: Secondary | ICD-10-CM

## 2024-08-31 ENCOUNTER — Ambulatory Visit
Admission: RE | Admit: 2024-08-31 | Discharge: 2024-08-31 | Disposition: A | Source: Ambulatory Visit | Attending: Internal Medicine | Admitting: Internal Medicine

## 2024-08-31 DIAGNOSIS — Z1231 Encounter for screening mammogram for malignant neoplasm of breast: Secondary | ICD-10-CM

## 2024-09-09 ENCOUNTER — Other Ambulatory Visit: Payer: Self-pay | Admitting: Neurological Surgery

## 2024-09-09 DIAGNOSIS — M542 Cervicalgia: Secondary | ICD-10-CM

## 2024-09-12 ENCOUNTER — Other Ambulatory Visit

## 2024-09-12 DIAGNOSIS — M542 Cervicalgia: Secondary | ICD-10-CM
